# Patient Record
Sex: Female | Born: 1995 | Race: Black or African American | Hispanic: No | Marital: Single | Smoking: Never smoker
Health system: Southern US, Community
[De-identification: ages and names within clinical notes are randomized; demographics above are authoritative.]

## PROBLEM LIST (undated history)

## (undated) ENCOUNTER — Emergency Department (HOSPITAL_COMMUNITY): Admission: EM | Disposition: A | Discharge status: Home / Self Care | Payer: BC Managed Care – PPO

## (undated) DIAGNOSIS — J45909 Unspecified asthma, uncomplicated: Secondary | ICD-10-CM

## (undated) DIAGNOSIS — F419 Anxiety disorder, unspecified: Secondary | ICD-10-CM

## (undated) DIAGNOSIS — K219 Gastro-esophageal reflux disease without esophagitis: Secondary | ICD-10-CM

## (undated) HISTORY — PX: ANKLE FRACTURE SURGERY: SHX122

## (undated) HISTORY — DX: Anxiety disorder, unspecified: F41.9

## (undated) HISTORY — PX: TONSILLECTOMY: SUR1361

## (undated) HISTORY — PX: OTHER SURGICAL HISTORY: SHX169

---

## 2014-05-09 ENCOUNTER — Emergency Department (HOSPITAL_COMMUNITY)
Admission: EM | Admit: 2014-05-09 | Discharge: 2014-05-10 | Disposition: A | Discharge status: Home / Self Care | Payer: BC Managed Care – PPO | Attending: Emergency Medicine | Admitting: Emergency Medicine

## 2014-05-09 ENCOUNTER — Encounter (HOSPITAL_COMMUNITY): Payer: Self-pay | Admitting: Emergency Medicine

## 2014-05-09 DIAGNOSIS — Z3202 Encounter for pregnancy test, result negative: Secondary | ICD-10-CM | POA: Insufficient documentation

## 2014-05-09 DIAGNOSIS — R102 Pelvic and perineal pain: Secondary | ICD-10-CM

## 2014-05-09 DIAGNOSIS — N949 Unspecified condition associated with female genital organs and menstrual cycle: Secondary | ICD-10-CM | POA: Insufficient documentation

## 2014-05-09 DIAGNOSIS — R1012 Left upper quadrant pain: Secondary | ICD-10-CM | POA: Insufficient documentation

## 2014-05-09 DIAGNOSIS — R1032 Left lower quadrant pain: Secondary | ICD-10-CM | POA: Insufficient documentation

## 2014-05-09 DIAGNOSIS — R6883 Chills (without fever): Secondary | ICD-10-CM | POA: Insufficient documentation

## 2014-05-09 DIAGNOSIS — M940 Chondrocostal junction syndrome [Tietze]: Secondary | ICD-10-CM | POA: Insufficient documentation

## 2014-05-09 DIAGNOSIS — Z8781 Personal history of (healed) traumatic fracture: Secondary | ICD-10-CM | POA: Insufficient documentation

## 2014-05-09 DIAGNOSIS — Z8719 Personal history of other diseases of the digestive system: Secondary | ICD-10-CM | POA: Insufficient documentation

## 2014-05-09 HISTORY — DX: Gastro-esophageal reflux disease without esophagitis: K21.9

## 2014-05-09 LAB — PREGNANCY, URINE: Preg Test, Ur: NEGATIVE

## 2014-05-09 NOTE — ED Notes (Signed)
Pt c/o left sided flank pain radiating to llq. Pt c/o nausea. Denies dysuria. Pt c/o mild right sided chest pain that burns.

## 2014-05-10 ENCOUNTER — Ambulatory Visit (HOSPITAL_COMMUNITY)
Admit: 2014-05-10 | Discharge: 2014-05-10 | Disposition: A | Discharge status: Home / Self Care | Payer: BC Managed Care – PPO | Source: Ambulatory Visit | Attending: Emergency Medicine | Admitting: Emergency Medicine

## 2014-05-10 ENCOUNTER — Other Ambulatory Visit (HOSPITAL_COMMUNITY): Payer: Self-pay | Admitting: Emergency Medicine

## 2014-05-10 ENCOUNTER — Ambulatory Visit (HOSPITAL_COMMUNITY)
Admit: 2014-05-10 | Discharge: 2014-05-10 | Disposition: A | Discharge status: Home / Self Care | Payer: BC Managed Care – PPO | Attending: Emergency Medicine | Admitting: Emergency Medicine

## 2014-05-10 DIAGNOSIS — R1032 Left lower quadrant pain: Secondary | ICD-10-CM | POA: Insufficient documentation

## 2014-05-10 LAB — URINALYSIS, ROUTINE W REFLEX MICROSCOPIC
BILIRUBIN URINE: NEGATIVE
Glucose, UA: NEGATIVE mg/dL
Ketones, ur: NEGATIVE mg/dL
Leukocytes, UA: NEGATIVE
Nitrite: NEGATIVE
PH: 6 (ref 5.0–8.0)
Protein, ur: NEGATIVE mg/dL
SPECIFIC GRAVITY, URINE: 1.015 (ref 1.005–1.030)
Urobilinogen, UA: 0.2 mg/dL (ref 0.0–1.0)

## 2014-05-10 LAB — URINE MICROSCOPIC-ADD ON

## 2014-05-10 LAB — WET PREP, GENITAL
Trich, Wet Prep: NONE SEEN
YEAST WET PREP: NONE SEEN

## 2014-05-10 MED ORDER — HYDROCODONE-ACETAMINOPHEN 5-325 MG PO TABS: 1.0000 | ORAL_TABLET | ORAL | Status: DC | PRN

## 2014-05-10 MED ORDER — HYDROCODONE-ACETAMINOPHEN 5-325 MG PO TABS
1.0000 | ORAL_TABLET | Freq: Once | ORAL | Status: AC
Start: 2014-05-10 — End: 2014-05-10
  Administered 2014-05-10: 1 via ORAL
  Filled 2014-05-10: qty 1

## 2014-05-10 MED ORDER — NAPROXEN 250 MG PO TABS
500.0000 mg | ORAL_TABLET | Freq: Once | ORAL | Status: AC
  Administered 2014-05-10: 500 mg via ORAL
  Filled 2014-05-10: qty 2

## 2014-05-10 NOTE — ED Provider Notes (Signed)
Pt returns for outpatient pelvic US.   Pt given her results. She does not have a GYN, recommended [facility] as it is the only GYN office in [place]. She states she has left sided abdominal/pelvic pain. She was given pain medication to take. Advised to return for fever, vomiting or worsening pain.    [place] Transvaginal Non-ob  [place] Pelvis Complete  05/10/2014   CLINICAL DATA:  Left lower quadrant pain  EXAM: TRANSABDOMINAL AND TRANSVAGINAL ULTRASOUND OF PELVIS  TECHNIQUE: Both transabdominal and transvaginal ultrasound examinations of the pelvis were performed. Transabdominal technique was performed for global imaging of the pelvis including uterus, ovaries, adnexal regions, and pelvic cul-de-sac. It was necessary to proceed with endovaginal exam following the transabdominal exam to visualize the ovaries and adnexa in better detail.  COMPARISON:  None  FINDINGS: Uterus  Measurements: 7.1 x 3.2 x 4.3 cm. No fibroids or other mass visualized.  Endometrium  Thickness: 1.4 mm.  No focal abnormality visualized.  Right ovary  Measurements: 3.7 x 2.1 x 4.1 cm. Normal appearance/no adnexal mass.  Left ovary  Measurements: 4.3 x 2.8 x 2.8 cm. Normal appearance/no adnexal mass.  Other findings  No free fluid.  IMPRESSION: Normal pelvic ultrasound for age.  No acute finding.   Electronically Signed   By: Ruel Favorsrevor  Shick M.D.   On: 05/10/2014 10:19    Sonya AlbeIva Maureen Delatte, MD, Franz DellFACEP   Cella Cappello L Marcelline Temkin, MD 05/10/14 [phone]

## 2014-05-10 NOTE — ED Provider Notes (Addendum)
CSN: [id number]     Arrival date & time 05/09/14  2215 History   First MD Initiated Contact with Patient 05/10/14 0007   This chart was scribed for Hanley Seamen, MD by Gwenevere Abbot, ED scribe. This patient was seen in room APA15/APA15 and the patient's care was started at 12:09 AM.    Chief Complaint  Patient presents with  . Abdominal Pain   The history is provided by the patient. No language interpreter was used.   HPI Comments:  Sonya Alexander is a 18 y.o. female who presents to the Emergency Department complaining of left flank pain that radiates to the epigastrium since yesterday. The pain is about a 7/10. It is worse with movement or palpation. She is also having chest wall pain primarily on the right parasternal region, worse with movement or palpation. She denies shortness of breath. She denies fever, vomiting, diarrhea, vaginal bleeding, vaginal discharge, dysuria or hematuria. She has had chills and nausea.  Past Medical History  Diagnosis Date  . GERD (gastroesophageal reflux disease)    Past Surgical History  Procedure Laterality Date  . Tonsillectomy    . Ankle fracture surgery     No family history on file. History  Substance Use Topics  . Smoking status: Never Smoker   . Smokeless tobacco: Not on file  . Alcohol Use: No   OB History   Grav Para Term Preterm Abortions TAB SAB Ect Mult Living                 Review of Systems  Constitutional: Positive for chills.  Cardiovascular: Positive for chest pain.  Genitourinary: Positive for flank pain.  All other systems reviewed and are negative.   Allergies  Review of patient's allergies indicates no known allergies.  Home Medications   Prior to Admission medications   Not on File   BP 116/59  Pulse 80  Temp(Src) 98.8 F (37.1 C)  Resp 20  Ht 5' 6.5" (1.689 m)  Wt 149 lb (67.586 kg)  BMI 23.69 kg/m2  SpO2 100%  LMP 04/28/2014 Physical Exam  Nursing note and vitals reviewed. Constitutional: She is  oriented to person, place, and time. She appears well-developed and well-nourished.  HENT:  Head: Normocephalic and atraumatic.  Eyes: EOM are normal. Pupils are equal, round, and reactive to light.  Neck: Normal range of motion. Neck supple.  Cardiovascular: Normal rate, regular rhythm and normal heart sounds.   Pulmonary/Chest: Effort normal. She exhibits tenderness (Bilateral peristernal tenderness; right greater than left.).  Abdominal: Soft. Bowel sounds are normal. She exhibits no mass. There is tenderness (LUQ and LLQ tenderness. ).  Genitourinary:  Left CVA tenderness.   Musculoskeletal: Normal range of motion.  Neurological: She is alert and oriented to person, place, and time.  Skin: Skin is warm and dry.  Psychiatric:  Flat affect.  GU: Normal external genitalia; cervical erythema; green mucoid cervical discharge; no cervical motion tenderness; no adnexal tenderness  ED Course  Procedures  DIAGNOSTIC STUDIES: Oxygen Saturation is 100% on RA, normal by my interpretation.  COORDINATION OF CARE: 12:16 AM-Discussed treatment plan with pt at bedside and pt agreed to plan.   MDM   Nursing notes and vitals signs, including pulse oximetry, reviewed.  Summary of this visit's results, reviewed by myself:  Labs:  Results for orders placed during the hospital encounter of 05/09/14 (from the past 24 hour(s))  URINALYSIS, ROUTINE W REFLEX MICROSCOPIC     Status: Abnormal   Collection Time  05/09/14 11:44 PM      Result Value Ref Range   Color, Urine YELLOW  YELLOW   APPearance CLEAR  CLEAR   Specific Gravity, Urine 1.015  1.005 - 1.030   pH 6.0  5.0 - 8.0   Glucose, UA NEGATIVE  NEGATIVE mg/dL   Hgb urine dipstick TRACE (*) NEGATIVE   Bilirubin Urine NEGATIVE  NEGATIVE   Ketones, ur NEGATIVE  NEGATIVE mg/dL   Protein, ur NEGATIVE  NEGATIVE mg/dL   Urobilinogen, UA 0.2  0.0 - 1.0 mg/dL   Nitrite NEGATIVE  NEGATIVE   Leukocytes, UA NEGATIVE  NEGATIVE  PREGNANCY, URINE      Status: None   Collection Time    05/09/14 11:44 PM      Result Value Ref Range   Preg Test, Ur NEGATIVE  NEGATIVE  URINE MICROSCOPIC-ADD ON     Status: None   Collection Time    05/09/14 11:44 PM      Result Value Ref Range   Squamous Epithelial / LPF RARE  RARE   WBC, UA 0-2  <3 WBC/hpf   RBC / HPF 0-2  <3 RBC/hpf   Bacteria, UA RARE  RARE  WET PREP, GENITAL     Status: Abnormal   Collection Time    05/10/14 12:49 AM      Result Value Ref Range   Yeast Wet Prep HPF POC NONE SEEN  NONE SEEN   Trich, Wet Prep NONE SEEN  NONE SEEN   Clue Cells Wet Prep HPF POC FEW (*) NONE SEEN   WBC, Wet Prep HPF POC FEW (*) NONE SEEN   1:21 AM The patient states she was tested for STDs by her PCP a month ago and was negative. She was treated for BV and yeast infection at that time. She has not been sexually active in several months and when she was sexually active states he used protection. Though her cervix is erythematous with mucoid discharge she had no cervical motion tenderness or adnexal tenderness. Her symptoms are still concerning for gynecologic origin and we will have her return for an ultrasound.  I personally performed the services described in this documentation, which was scribed in my presence. The recorded information has been reviewed and is accurate.   Hanley SeamenJohn L Ola Fawver, MD 05/10/14 0122  Carlisle BeersJohn L Monzerat Handler, MD 05/10/14 [phone]

## 2014-05-12 LAB — GC/CHLAMYDIA PROBE AMP
CT Probe RNA: NEGATIVE
GC Probe RNA: NEGATIVE

## 2014-05-16 MED FILL — Hydrocodone-Acetaminophen Tab 5-325 MG: ORAL | Qty: 6 | Status: AC

## 2014-06-05 ENCOUNTER — Encounter (HOSPITAL_COMMUNITY): Payer: Self-pay | Admitting: Emergency Medicine

## 2014-06-05 ENCOUNTER — Emergency Department (HOSPITAL_COMMUNITY)
Admission: EM | Admit: 2014-06-05 | Discharge: 2014-06-05 | Disposition: A | Discharge status: Home / Self Care | Payer: BC Managed Care – PPO | Attending: Emergency Medicine | Admitting: Emergency Medicine

## 2014-06-05 DIAGNOSIS — Z8781 Personal history of (healed) traumatic fracture: Secondary | ICD-10-CM | POA: Diagnosis not present

## 2014-06-05 DIAGNOSIS — Z79899 Other long term (current) drug therapy: Secondary | ICD-10-CM | POA: Diagnosis not present

## 2014-06-05 DIAGNOSIS — Z8719 Personal history of other diseases of the digestive system: Secondary | ICD-10-CM | POA: Diagnosis not present

## 2014-06-05 DIAGNOSIS — N3090 Cystitis, unspecified without hematuria: Secondary | ICD-10-CM | POA: Diagnosis not present

## 2014-06-05 DIAGNOSIS — Z3202 Encounter for pregnancy test, result negative: Secondary | ICD-10-CM | POA: Insufficient documentation

## 2014-06-05 DIAGNOSIS — R319 Hematuria, unspecified: Secondary | ICD-10-CM | POA: Diagnosis present

## 2014-06-05 LAB — POC URINE PREG, ED: Preg Test, Ur: NEGATIVE

## 2014-06-05 LAB — URINE MICROSCOPIC-ADD ON

## 2014-06-05 LAB — URINALYSIS, ROUTINE W REFLEX MICROSCOPIC
Bilirubin Urine: NEGATIVE
Glucose, UA: NEGATIVE mg/dL
Ketones, ur: NEGATIVE mg/dL
Nitrite: NEGATIVE
Protein, ur: NEGATIVE mg/dL
Specific Gravity, Urine: 1.012 (ref 1.005–1.030)
Urobilinogen, UA: 0.2 mg/dL (ref 0.0–1.0)
pH: 7 (ref 5.0–8.0)

## 2014-06-05 MED ORDER — SULFAMETHOXAZOLE-TRIMETHOPRIM 800-160 MG PO TABS: 1.0000 | ORAL_TABLET | Freq: Two times a day (BID) | ORAL | Status: DC

## 2014-06-05 MED ORDER — PHENAZOPYRIDINE HCL 200 MG PO TABS: 200.0000 mg | ORAL_TABLET | Freq: Three times a day (TID) | ORAL | Status: DC

## 2014-06-05 MED ORDER — SULFAMETHOXAZOLE-TMP DS 800-160 MG PO TABS
1.0000 | ORAL_TABLET | Freq: Once | ORAL | Status: AC
  Administered 2014-06-05: 1 via ORAL
  Filled 2014-06-05: qty 1

## 2014-06-05 MED ORDER — PHENAZOPYRIDINE HCL 100 MG PO TABS
95.0000 mg | ORAL_TABLET | Freq: Once | ORAL | Status: AC
  Administered 2014-06-05: 100 mg via ORAL
  Filled 2014-06-05: qty 1

## 2014-06-05 NOTE — ED Notes (Signed)
Per pt sts since yesterday she has been having blood in urine and pain with urination. sts also some cold chills.

## 2014-06-05 NOTE — ED Provider Notes (Signed)
CSN: [id number]     Arrival date & time 06/05/14  1428 History   First MD Initiated Contact with Patient 06/05/14 1757     Chief Complaint  Patient presents with  . Hematuria     (Consider location/radiation/quality/duration/timing/severity/associated sxs/prior Treatment) HPI Sonya Alexander is a 18 y.o. female who presents emergency department complaining of dysuria and hematuria. Patient states her symptoms started yesterday. States she has seen bright red blood in her urination she urinates. She thought it might be vaginal so she placed a tampon, but continued to have blood in her urine. States there is no blood in her tampon when she removed it. Patient reports, lower abdominal pressure when she urinates, and burning sensation. She denies any fever or chills. She denies any back pain. She denies any vomiting but does admit to some nausea. She has not tried any medications for this. She denies history of the same. She denies abnormal vaginal discharge or bleeding. No other complaints.  Past Medical History  Diagnosis Date  . GERD (gastroesophageal reflux disease)    Past Surgical History  Procedure Laterality Date  . Tonsillectomy    . Ankle fracture surgery     History reviewed. No pertinent family history. History  Substance Use Topics  . Smoking status: Never Smoker   . Smokeless tobacco: Not on file  . Alcohol Use: No   OB History   Grav Para Term Preterm Abortions TAB SAB Ect Mult Living                 Review of Systems  Constitutional: Negative for fever and chills.  Respiratory: Negative for cough, chest tightness and shortness of breath.   Cardiovascular: Negative for chest pain, palpitations and leg swelling.  Gastrointestinal: Positive for nausea and abdominal pain. Negative for vomiting and diarrhea.  Genitourinary: Positive for dysuria and hematuria. Negative for flank pain, vaginal bleeding, vaginal discharge, vaginal pain and pelvic pain.  Musculoskeletal:  Negative for arthralgias, myalgias, neck pain and neck stiffness.  Skin: Negative for rash.  Neurological: Negative for dizziness, weakness and headaches.  All other systems reviewed and are negative.     Allergies  Review of patient's allergies indicates no known allergies.  Home Medications   Prior to Admission medications   Medication Sig Start Date End Date Taking? Authorizing Provider  albuterol (PROVENTIL HFA;VENTOLIN HFA) 108 (90 BASE) MCG/ACT inhaler Inhale 2 puffs into the lungs every 6 (six) hours as needed for wheezing or shortness of breath.   Yes Historical Provider, MD  etonogestrel (NEXPLANON) 68 MG IMPL implant Inject 1 each into the skin once.   Yes Historical Provider, MD  Simethicone (ANTI GAS PO) Take 1 tablet by mouth daily as needed (gas).   Yes Historical Provider, MD   BP 133/70  Pulse 86  Temp(Src) 98.3 F (36.8 C) (Oral)  Resp 16  Ht 5' 6.5" (1.689 m)  Wt 155 lb (70.308 kg)  BMI 24.65 kg/m2  SpO2 100%  LMP 04/29/2014 Physical Exam  Nursing note and vitals reviewed. Constitutional: She appears well-developed and well-nourished. No distress.  HENT:  Head: Normocephalic.  Eyes: Conjunctivae are normal.  Neck: Neck supple.  Cardiovascular: Normal rate, regular rhythm and normal heart sounds.   Pulmonary/Chest: Effort normal and breath sounds normal. No respiratory distress. She has no wheezes. She has no rales.  Abdominal: Soft. Bowel sounds are normal. She exhibits no distension. There is no tenderness. There is no rebound and no guarding.  No CVA tenderness bilaterally.  Musculoskeletal:  She exhibits no edema.  Neurological: She is alert.  Skin: Skin is warm and dry.  Psychiatric: She has a normal mood and affect. Her behavior is normal.    ED Course  Procedures (including critical care time) Labs Review Labs Reviewed  URINALYSIS, ROUTINE W REFLEX MICROSCOPIC - Abnormal; Notable for the following:    APPearance CLOUDY (*)    Hgb urine  dipstick LARGE (*)    Leukocytes, UA SMALL (*)    All other components within normal limits  URINE MICROSCOPIC-ADD ON  POC URINE PREG, ED    Imaging Review No results found.   EKG Interpretation None      MDM   Final diagnoses:  Cystitis    Patient with dysuria and hematuria, no abdominal tenderness on exam, no CVA tenderness. She's afebrile, nontoxic appearing. Urinalysis shows cloudy urine, small leukocytes, all 7-10 wbc's, large hemoglobin. Given she has no pain, I doubt this is a kidney stone. Most likely hemorrhagic cystitis. Will place on Bactrim, pyridium,  followup with primary care Dr. Given precautions to return if symptoms are worsening.  Filed Vitals:   06/05/14 1433 06/05/14 1810  BP: 137/66 133/70  Pulse: 86 86  Temp: 98.3 F (36.8 C)   TempSrc: Oral   Resp: 20 16  Height: 5' 6.5" (1.689 m)   Weight: 155 lb (70.308 kg)   SpO2: 100% 100%       Myriam Jacobsonatyana A Judye Lorino, PA-C 06/05/14 2358

## 2014-06-05 NOTE — Discharge Instructions (Signed)
Take Septra as prescribed until all gone. Pyridium for burning sensation. Tylenol or Motrin for pain. Followup with primary care doctor in one week. Return if your symptoms are worsening.  Urinary Tract Infection Urinary tract infections (UTIs) can develop anywhere along your urinary tract. Your urinary tract is your body's drainage system for removing wastes and extra water. Your urinary tract includes two kidneys, two ureters, a bladder, and a urethra. Your kidneys are a pair of bean-shaped organs. Each kidney is about the size of your fist. They are located below your ribs, one on each side of your spine. CAUSES Infections are caused by microbes, which are microscopic organisms, including fungi, viruses, and bacteria. These organisms are so small that they can only be seen through a microscope. Bacteria are the microbes that most commonly cause UTIs. SYMPTOMS  Symptoms of UTIs may vary by age and gender of the patient and by the location of the infection. Symptoms in young women typically include a frequent and intense urge to urinate and a painful, burning feeling in the bladder or urethra during urination. Older women and men are more likely to be tired, shaky, and weak and have muscle aches and abdominal pain. A fever may mean the infection is in your kidneys. Other symptoms of a kidney infection include pain in your back or sides below the ribs, nausea, and vomiting. DIAGNOSIS To diagnose a UTI, your caregiver will ask you about your symptoms. Your caregiver also will ask to provide a urine sample. The urine sample will be tested for bacteria and white blood cells. White blood cells are made by your body to help fight infection. TREATMENT  Typically, UTIs can be treated with medication. Because most UTIs are caused by a bacterial infection, they usually can be treated with the use of antibiotics. The choice of antibiotic and length of treatment depend on your symptoms and the type of bacteria  causing your infection. HOME CARE INSTRUCTIONS  If you were prescribed antibiotics, take them exactly as your caregiver instructs you. Finish the medication even if you feel better after you have only taken some of the medication.  Drink enough water and fluids to keep your urine clear or pale yellow.  Avoid caffeine, tea, and carbonated beverages. They tend to irritate your bladder.  Empty your bladder often. Avoid holding urine for long periods of time.  Empty your bladder before and after sexual intercourse.  After a bowel movement, women should cleanse from front to back. Use each tissue only once. SEEK MEDICAL CARE IF:   You have back pain.  You develop a fever.  Your symptoms do not begin to resolve within 3 days. SEEK IMMEDIATE MEDICAL CARE IF:   You have severe back pain or lower abdominal pain.  You develop chills.  You have nausea or vomiting.  You have continued burning or discomfort with urination. MAKE SURE YOU:   Understand these instructions.  Will watch your condition.  Will get help right away if you are not doing well or get worse. Document Released: 07/13/2005 Document Revised: 04/03/2012 Document Reviewed: 11/11/2011 [facility] Patient Information 2015 [employer] [place]. This information is not intended to replace advice given to you by your health care provider. Make sure you discuss any questions you have with your health care provider.

## 2014-06-10 NOTE — ED Provider Notes (Signed)
Medical screening examination/treatment/procedure(s) were performed by non-physician practitioner and as supervising physician I was immediately available for consultation/collaboration.   EKG Interpretation None       Thursa Emme, MD 06/10/14 1614 

## 2014-08-04 ENCOUNTER — Encounter (HOSPITAL_COMMUNITY): Payer: Self-pay | Admitting: Emergency Medicine

## 2014-08-04 ENCOUNTER — Emergency Department (HOSPITAL_COMMUNITY)
Admission: EM | Admit: 2014-08-04 | Discharge: 2014-08-04 | Disposition: A | Discharge status: Home / Self Care | Payer: BC Managed Care – PPO | Attending: Emergency Medicine | Admitting: Emergency Medicine

## 2014-08-04 DIAGNOSIS — J45909 Unspecified asthma, uncomplicated: Secondary | ICD-10-CM | POA: Insufficient documentation

## 2014-08-04 DIAGNOSIS — R109 Unspecified abdominal pain: Secondary | ICD-10-CM

## 2014-08-04 DIAGNOSIS — Z79899 Other long term (current) drug therapy: Secondary | ICD-10-CM | POA: Insufficient documentation

## 2014-08-04 DIAGNOSIS — Z3202 Encounter for pregnancy test, result negative: Secondary | ICD-10-CM | POA: Insufficient documentation

## 2014-08-04 DIAGNOSIS — Z8719 Personal history of other diseases of the digestive system: Secondary | ICD-10-CM | POA: Insufficient documentation

## 2014-08-04 HISTORY — DX: Unspecified asthma, uncomplicated: J45.909

## 2014-08-04 LAB — URINALYSIS, ROUTINE W REFLEX MICROSCOPIC
BILIRUBIN URINE: NEGATIVE
Glucose, UA: NEGATIVE mg/dL
Hgb urine dipstick: NEGATIVE
Ketones, ur: NEGATIVE mg/dL
Leukocytes, UA: NEGATIVE
NITRITE: NEGATIVE
PH: 7 (ref 5.0–8.0)
Protein, ur: NEGATIVE mg/dL
Specific Gravity, Urine: 1.01 (ref 1.005–1.030)
UROBILINOGEN UA: 0.2 mg/dL (ref 0.0–1.0)

## 2014-08-04 LAB — COMPREHENSIVE METABOLIC PANEL
ALT: 8 U/L (ref 0–35)
ANION GAP: 11 (ref 5–15)
AST: 11 U/L (ref 0–37)
Albumin: 4 g/dL (ref 3.5–5.2)
Alkaline Phosphatase: 67 U/L (ref 39–117)
BUN: 11 mg/dL (ref 6–23)
CALCIUM: 9.7 mg/dL (ref 8.4–10.5)
CO2: 26 meq/L (ref 19–32)
CREATININE: 0.75 mg/dL (ref 0.50–1.10)
Chloride: 104 mEq/L (ref 96–112)
Glucose, Bld: 98 mg/dL (ref 70–99)
Potassium: 4.1 mEq/L (ref 3.7–5.3)
Sodium: 141 mEq/L (ref 137–147)
Total Bilirubin: 0.4 mg/dL (ref 0.3–1.2)
Total Protein: 7.1 g/dL (ref 6.0–8.3)

## 2014-08-04 LAB — CBC
HCT: 38.7 % (ref 36.0–46.0)
Hemoglobin: 13.1 g/dL (ref 12.0–15.0)
MCH: 32 pg (ref 26.0–34.0)
MCHC: 33.9 g/dL (ref 30.0–36.0)
MCV: 94.6 fL (ref 78.0–100.0)
PLATELETS: 236 10*3/uL (ref 150–400)
RBC: 4.09 MIL/uL (ref 3.87–5.11)
RDW: 13.1 % (ref 11.5–15.5)
WBC: 6.2 10*3/uL (ref 4.0–10.5)

## 2014-08-04 LAB — PREGNANCY, URINE: Preg Test, Ur: NEGATIVE

## 2014-08-04 MED ORDER — DICYCLOMINE HCL 20 MG PO TABS: 20.0000 mg | ORAL_TABLET | Freq: Two times a day (BID) | ORAL | Status: DC

## 2014-08-04 NOTE — ED Notes (Signed)
Patient complaining of abdominal pain with other multiple complaints.

## 2014-08-04 NOTE — Discharge Instructions (Signed)
Your testing has been normal - have your doctor follow up on the thyroid test - you have normal blood work and urine testing otherwise - this may be irritable bowel syndrome.  Please call your doctor for a followup appointment within 24-48 hours. When you talk to your doctor please let them know that you were seen in the emergency department and have them acquire all of your records so that they can discuss the findings with you and formulate a treatment plan to fully care for your new and ongoing problems.

## 2014-08-04 NOTE — ED Provider Notes (Signed)
CSN: [id number]     Arrival date & time 08/04/14  1609 History   First MD Initiated Contact with Patient 08/04/14 1943     Chief Complaint  Patient presents with  . Abdominal Pain     (Consider location/radiation/quality/duration/timing/severity/associated sxs/prior Treatment) HPI Comments: Approximately July started having was having decreased appetite, was having some increased bowel sounds and intermittent abdominal pains.  Has been occuring daily since that time.  Takes meds for GERD - takes meds when she needs it.  Sx are intermittent, most days it is present, not associated with rectal bleeding or watery diarrhea.  Denies vag d/c.  When she eats - she gets early satiety, feels nauseated but doesn't vomit.  Is able to get 1/2 meal down - has lost ~ 20 pounds in sevral months.  She has ongoing light headedness.  Doesn't have menstrual cycles since implanon in July.    Bowels, seem to have regular soft stools - uses bathroom frequently.    Has some discoloration of fingers and toes when they get cold, turn purple and pale.    Has never had thyroid tested.    Patient is a 18 y.o. female presenting with abdominal pain. The history is provided by the patient.  Abdominal Pain   Past Medical History  Diagnosis Date  . GERD (gastroesophageal reflux disease)   . Asthma    Past Surgical History  Procedure Laterality Date  . Tonsillectomy    . Ankle fracture surgery    . Tubes in ears     History reviewed. No pertinent family history. History  Substance Use Topics  . Smoking status: Never Smoker   . Smokeless tobacco: Not on file  . Alcohol Use: No   OB History   Grav Para Term Preterm Abortions TAB SAB Ect Mult Living                 Review of Systems  Gastrointestinal: Positive for abdominal pain.  All other systems reviewed and are negative.     Allergies  Review of patient's allergies indicates no known allergies.  Home Medications   Prior to Admission  medications   Medication Sig Start Date End Date Taking? Authorizing Provider  albuterol (PROVENTIL HFA;VENTOLIN HFA) 108 (90 BASE) MCG/ACT inhaler Inhale 2 puffs into the lungs every 6 (six) hours as needed for wheezing or shortness of breath.    Historical Provider, MD  dicyclomine (BENTYL) 20 MG tablet Take 1 tablet (20 mg total) by mouth 2 (two) times daily. 08/04/14   Vida RollerBrian D Maili Shutters, MD  etonogestrel (NEXPLANON) 68 MG IMPL implant Inject 1 each into the skin once.    Historical Provider, MD   BP 115/68  Pulse 75  Temp(Src) 99.4 F (37.4 C) (Oral)  Resp 16  Ht 5\' 7"  (1.702 m)  Wt 150 lb (68.04 kg)  BMI 23.49 kg/m2  SpO2 100% Physical Exam  Nursing note and vitals reviewed. Constitutional: She appears well-developed and well-nourished. No distress.  HENT:  Head: Normocephalic and atraumatic.  Mouth/Throat: Oropharynx is clear and moist. No oropharyngeal exudate.  Eyes: Conjunctivae and EOM are normal. Pupils are equal, round, and reactive to light. Right eye exhibits no discharge. Left eye exhibits no discharge. No scleral icterus.  Neck: Normal range of motion. Neck supple. No JVD present. No thyromegaly present.  Cardiovascular: Normal rate, regular rhythm, normal heart sounds and intact distal pulses.  Exam reveals no gallop and no friction rub.   No murmur heard. Pulmonary/Chest: Effort normal and  breath sounds normal. No respiratory distress. She has no wheezes. She has no rales.  Abdominal: Soft. Bowel sounds are normal. She exhibits no distension and no mass. There is no tenderness.  Musculoskeletal: Normal range of motion. She exhibits no edema and no tenderness.  Lymphadenopathy:    She has no cervical adenopathy.  Neurological: She is alert. Coordination normal.  Skin: Skin is warm and dry. No rash noted. No erythema.  Psychiatric: She has a normal mood and affect. Her behavior is normal.    ED Course  Procedures (including critical care time) Labs Review Labs  Reviewed  URINALYSIS, ROUTINE W REFLEX MICROSCOPIC - Abnormal; Notable for the following:    Color, Urine STRAW (*)    All other components within normal limits  PREGNANCY, URINE  CBC  COMPREHENSIVE METABOLIC PANEL  TSH    Imaging Review No results found.    MDM   Final diagnoses:  Abdominal pain, unspecified abdominal location    At this time she has no abd ttp, normal VS and history that points to IBS more than any other source - check liver and GB function, r/o anemia and check thyroid.  Pt may have Raynaud's phenomenon - not at this time.  Tolerating PO, labs normal, pt informed, TSH pending, stable for d/c.  Meds given in ED:  Medications - No data to display  New Prescriptions   DICYCLOMINE (BENTYL) 20 MG TABLET    Take 1 tablet (20 mg total) by mouth 2 (two) times daily.      Vida RollerBrian D Anthany Thornhill, MD 08/04/14 2123

## 2014-08-05 LAB — TSH: TSH: 1.37 u[IU]/mL (ref 0.350–4.500)

## 2015-04-16 ENCOUNTER — Emergency Department (HOSPITAL_COMMUNITY)
Admission: EM | Admit: 2015-04-16 | Discharge: 2015-04-16 | Disposition: A | Discharge status: Home / Self Care | Payer: BLUE CROSS/BLUE SHIELD | Attending: Emergency Medicine | Admitting: Emergency Medicine

## 2015-04-16 ENCOUNTER — Encounter (HOSPITAL_COMMUNITY): Payer: Self-pay | Admitting: Emergency Medicine

## 2015-04-16 ENCOUNTER — Emergency Department (HOSPITAL_COMMUNITY): Payer: BLUE CROSS/BLUE SHIELD

## 2015-04-16 DIAGNOSIS — H9209 Otalgia, unspecified ear: Secondary | ICD-10-CM | POA: Diagnosis not present

## 2015-04-16 DIAGNOSIS — Z793 Long term (current) use of hormonal contraceptives: Secondary | ICD-10-CM | POA: Insufficient documentation

## 2015-04-16 DIAGNOSIS — H538 Other visual disturbances: Secondary | ICD-10-CM | POA: Diagnosis not present

## 2015-04-16 DIAGNOSIS — J45909 Unspecified asthma, uncomplicated: Secondary | ICD-10-CM | POA: Diagnosis not present

## 2015-04-16 DIAGNOSIS — Z79899 Other long term (current) drug therapy: Secondary | ICD-10-CM | POA: Diagnosis not present

## 2015-04-16 DIAGNOSIS — Z3202 Encounter for pregnancy test, result negative: Secondary | ICD-10-CM | POA: Diagnosis not present

## 2015-04-16 DIAGNOSIS — Z8719 Personal history of other diseases of the digestive system: Secondary | ICD-10-CM | POA: Diagnosis not present

## 2015-04-16 DIAGNOSIS — R42 Dizziness and giddiness: Secondary | ICD-10-CM | POA: Insufficient documentation

## 2015-04-16 DIAGNOSIS — J069 Acute upper respiratory infection, unspecified: Secondary | ICD-10-CM | POA: Diagnosis not present

## 2015-04-16 DIAGNOSIS — R05 Cough: Secondary | ICD-10-CM | POA: Diagnosis present

## 2015-04-16 LAB — CBC WITH DIFFERENTIAL/PLATELET
BASOS PCT: 0 % (ref 0–1)
Basophils Absolute: 0 10*3/uL (ref 0.0–0.1)
EOS ABS: 0.1 10*3/uL (ref 0.0–0.7)
Eosinophils Relative: 1 % (ref 0–5)
HEMATOCRIT: 47.4 % — AB (ref 36.0–46.0)
Hemoglobin: 15.9 g/dL — ABNORMAL HIGH (ref 12.0–15.0)
LYMPHS PCT: 19 % (ref 12–46)
Lymphs Abs: 1.6 10*3/uL (ref 0.7–4.0)
MCH: 31.1 pg (ref 26.0–34.0)
MCHC: 33.5 g/dL (ref 30.0–36.0)
MCV: 92.6 fL (ref 78.0–100.0)
Monocytes Absolute: 0.4 10*3/uL (ref 0.1–1.0)
Monocytes Relative: 5 % (ref 3–12)
Neutro Abs: 6.5 10*3/uL (ref 1.7–7.7)
Neutrophils Relative %: 75 % (ref 43–77)
Platelets: 255 10*3/uL (ref 150–400)
RBC: 5.12 MIL/uL — AB (ref 3.87–5.11)
RDW: 14.8 % (ref 11.5–15.5)
WBC: 8.6 10*3/uL (ref 4.0–10.5)

## 2015-04-16 LAB — COMPREHENSIVE METABOLIC PANEL
ALBUMIN: 4.6 g/dL (ref 3.5–5.0)
ALT: 14 U/L (ref 14–54)
ANION GAP: 7 (ref 5–15)
AST: 15 U/L (ref 15–41)
Alkaline Phosphatase: 77 U/L (ref 38–126)
BILIRUBIN TOTAL: 0.6 mg/dL (ref 0.3–1.2)
BUN: 7 mg/dL (ref 6–20)
CALCIUM: 9.1 mg/dL (ref 8.9–10.3)
CO2: 24 mmol/L (ref 22–32)
Chloride: 106 mmol/L (ref 101–111)
Creatinine, Ser: 0.68 mg/dL (ref 0.44–1.00)
GFR calc Af Amer: >60 mL/min (ref >60)
GFR calc non Af Amer: >60 mL/min (ref >60)
Glucose, Bld: 98 mg/dL (ref 65–99)
Potassium: 3.8 mmol/L (ref 3.5–5.1)
Sodium: 137 mmol/L (ref 135–145)
Total Protein: 7.6 g/dL (ref 6.5–8.1)

## 2015-04-16 LAB — URINALYSIS, ROUTINE W REFLEX MICROSCOPIC
BILIRUBIN URINE: NEGATIVE
GLUCOSE, UA: NEGATIVE mg/dL
Ketones, ur: NEGATIVE mg/dL
LEUKOCYTES UA: NEGATIVE
Nitrite: NEGATIVE
PH: 7 (ref 5.0–8.0)
PROTEIN: NEGATIVE mg/dL
Specific Gravity, Urine: 1.01 (ref 1.005–1.030)
Urobilinogen, UA: 0.2 mg/dL (ref 0.0–1.0)

## 2015-04-16 LAB — URINE MICROSCOPIC-ADD ON

## 2015-04-16 LAB — PREGNANCY, URINE: PREG TEST UR: NEGATIVE

## 2015-04-16 MED ORDER — SODIUM CHLORIDE 0.9 % IV SOLN
1000.0000 mL | Freq: Once | INTRAVENOUS | Status: AC
  Administered 2015-04-16: 1000 mL via INTRAVENOUS

## 2015-04-16 MED ORDER — SODIUM CHLORIDE 0.9 % IV SOLN
1000.0000 mL | INTRAVENOUS | Status: DC
  Administered 2015-04-16: 1000 mL via INTRAVENOUS

## 2015-04-16 NOTE — ED Notes (Signed)
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  Started with cough on Monday.  C/o earache to right and left ear.  Rates pain 8/10.

## 2015-04-16 NOTE — ED Notes (Signed)
Symptoms began on Monday. Deep cough, brown in color. Began feeling light headed yesterday. Pt is sniffling although denies nasal congestion. No one else in family is sick. Pt states her mother has a history of anemia and would like to be be checked to make sure her blood isn't low.

## 2015-04-16 NOTE — ED Provider Notes (Signed)
CSN: [id number]     Arrival date & time 04/16/15  1030 History   First MD Initiated Contact with Patient 04/16/15 1114     Chief Complaint  Patient presents with  . Cough  . Otalgia  . Blurred Vision     (Consider location/radiation/quality/duration/timing/severity/associated sxs/prior Treatment) HPI Comments: Patient is a 19 year old female who presents to the emergency department with a complaint of follow cough, body aches, generally not feeling well. The patient states these problems started on or about June 27. The patient complained of follow cough and some congestion. She noted some pain in her E her at times. She states that she has been coughing up of sometimes clear material, but sometimes a dark brown material. The patient states that she has not had any high fever. She's not had any shaking chills, and his been no vomiting or diarrhea. There's been no hot joints reported. It is of note that the patient was in [place] about the beginning of this month. The patient states that she was not exposed to any known illnesses, and particularly no known Ebola. There's been no unusual rash reported.  The history is provided by the patient.    Past Medical History  Diagnosis Date  . GERD (gastroesophageal reflux disease)   . Asthma    Past Surgical History  Procedure Laterality Date  . Tonsillectomy    . Ankle fracture surgery    . Tubes in ears     History reviewed. No pertinent family history. History  Substance Use Topics  . Smoking status: Never Smoker   . Smokeless tobacco: Not on file  . Alcohol Use: No   OB History    No data available     Review of Systems  Constitutional: Positive for appetite change. Negative for fever and chills.  HENT: Positive for congestion and ear pain.   Respiratory: Positive for cough.   Neurological: Positive for light-headedness.  All other systems reviewed and are negative.     Allergies  Review of patient's allergies  indicates no known allergies.  Home Medications   Prior to Admission medications   Medication Sig Start Date End Date Taking? Authorizing Provider  albuterol (PROVENTIL HFA;VENTOLIN HFA) 108 (90 BASE) MCG/ACT inhaler Inhale 2 puffs into the lungs every 6 (six) hours as needed for wheezing or shortness of breath.   Yes Historical Provider, MD  etonogestrel (NEXPLANON) 68 MG IMPL implant Inject 1 each into the skin once.   Yes Historical Provider, MD   BP 127/57 mmHg  Pulse 73  Temp(Src) 98.3 F (36.8 C) (Oral)  Resp 16  Ht 5\' 7"  (1.702 m)  Wt 140 lb (63.504 kg)  BMI 21.92 kg/m2  SpO2 100% Physical Exam  Constitutional: She is oriented to person, place, and time. She appears well-developed and well-nourished.  Non-toxic appearance.  HENT:  Head: Normocephalic.  Right Ear: Tympanic membrane and external ear normal.  Left Ear: Tympanic membrane and external ear normal.  Nasal congestion  Eyes: EOM and lids are normal. Pupils are equal, round, and reactive to light.  Neck: Normal range of motion. Neck supple. Carotid bruit is not present.  Cardiovascular: Normal rate, regular rhythm, normal heart sounds, intact distal pulses and normal pulses.   Pulmonary/Chest: Breath sounds normal. No respiratory distress.  Abdominal: Soft. Bowel sounds are normal. There is no tenderness. There is no guarding.  Musculoskeletal: Normal range of motion.  Lymphadenopathy:       Head (right side): No submandibular adenopathy present.  Head (left side): No submandibular adenopathy present.    She has no cervical adenopathy.  Neurological: She is alert and oriented to person, place, and time. She has normal strength. No cranial nerve deficit or sensory deficit.  Skin: Skin is warm and dry.  Psychiatric: She has a normal mood and affect. Her speech is normal.  Nursing note and vitals reviewed.   ED Course  Case reviewed by Dr Deretha Emory.  Procedures (including critical care time) Labs  Review Labs Reviewed  CBC WITH DIFFERENTIAL/PLATELET - Abnormal; Notable for the following:    RBC 5.12 (*)    Hemoglobin 15.9 (*)    HCT 47.4 (*)    All other components within normal limits  URINALYSIS, ROUTINE W REFLEX MICROSCOPIC (NOT AT [facility]) - Abnormal; Notable for the following:    Hgb urine dipstick TRACE (*)    All other components within normal limits  COMPREHENSIVE METABOLIC PANEL  PREGNANCY, URINE  URINE MICROSCOPIC-ADD ON    Imaging Review Dg Chest 2 View  04/16/2015   CLINICAL DATA:  Productive cough and chest pain for 2 days  EXAM: CHEST  2 VIEW  COMPARISON:  None.  FINDINGS: Lungs are clear. Heart size and pulmonary vascularity are normal. No adenopathy. No pneumothorax. No bone lesions.  IMPRESSION: No abnormality noted.   Electronically Signed   By: Bretta Bang III M.D.   On: 04/16/2015 13:38     EKG Interpretation None      MDM  Vital signs are well within normal limits. Patient has some congestion and cough in the emergency department time. Pulse oximetry is 100% on room air. Within normal limits by my interpretation. Complete blood count is well within normal limits. Components of metabolic panel is normal. Urinalysis shows a trace of hemoglobin, otherwise negative. Urine pregnancy test is negative. Chest x-ray is negative for pneumonia or acute event.  Suspect the patient has an upper respiratory infection. Patient is to increase fluids, use Tylenol or ibuprofen for fever, wash hands frequently on. Because of the patient's travel to [place] during the beginning of this month I have asked the patient to observe for temperature elevations, excessive headache, high fever, excessive vomiting, diarrhea, weakness. I've asked her to return immediately if any of these symptoms occur. The patient is in agreement with this discharge plan.    Final diagnoses:  None    **I have reviewed nursing notes, vital signs, and all appropriate lab and imaging results for  this patient.Ivery Quale, PA-C 04/16/15 1517  Ivery Quale, PA-C 04/16/15 1610  Vanetta Mulders, MD 04/16/15 [phone]

## 2015-04-16 NOTE — ED Notes (Signed)
Both eyes-------20/13 Right eye-------20/10 Left eye---------20/10

## 2015-04-16 NOTE — ED Notes (Signed)
Patient states eye feel blurred this am.

## 2015-04-16 NOTE — Discharge Instructions (Signed)
Your x-ray and lab test are negative for any acute problem. Suspect that you have an upper respiratory infection. Please increase water, juices, Gatorade. Please wash hands frequently. Please use Tylenol every 4 hours, or ibuprofen every 6 hours. Please use decongested of choice for nasal congestion. Please return immediately if any high fevers, shaking chills, unusual sweats, diarrhea, joint problems, or vomiting that is out of the ordinary. Upper Respiratory Infection, Adult An upper respiratory infection (URI) is also sometimes known as the common cold. The upper respiratory tract includes the nose, sinuses, throat, trachea, and bronchi. Bronchi are the airways leading to the lungs. Most people improve within 1 week, but symptoms can last up to 2 weeks. A residual cough may last even longer.  CAUSES Many different viruses can infect the tissues lining the upper respiratory tract. The tissues become irritated and inflamed and often become very moist. Mucus production is also common. A cold is contagious. You can easily spread the virus to others by oral contact. This includes kissing, sharing a glass, coughing, or sneezing. Touching your mouth or nose and then touching a surface, which is then touched by another person, can also spread the virus. SYMPTOMS  Symptoms typically develop 1 to 3 days after you come in contact with a cold virus. Symptoms vary from person to person. They may include:  Runny nose.  Sneezing.  Nasal congestion.  Sinus irritation.  Sore throat.  Loss of voice (laryngitis).  Cough.  Fatigue.  Muscle aches.  Loss of appetite.  Headache.  Low-grade fever. DIAGNOSIS  You might diagnose your own cold based on familiar symptoms, since most people get a cold 2 to 3 times a year. Your caregiver can confirm this based on your exam. Most importantly, your caregiver can check that your symptoms are not due to another disease such as strep throat, sinusitis, pneumonia,  asthma, or epiglottitis. Blood tests, throat tests, and X-rays are not necessary to diagnose a common cold, but they may sometimes be helpful in excluding other more serious diseases. Your caregiver will decide if any further tests are required. RISKS AND COMPLICATIONS  You may be at risk for a more severe case of the common cold if you smoke cigarettes, have chronic heart disease (such as heart failure) or lung disease (such as asthma), or if you have a weakened immune system. The very young and very old are also at risk for more serious infections. Bacterial sinusitis, middle ear infections, and bacterial pneumonia can complicate the common cold. The common cold can worsen asthma and chronic obstructive pulmonary disease (COPD). Sometimes, these complications can require emergency medical care and may be life-threatening. PREVENTION  The best way to protect against getting a cold is to practice good hygiene. Avoid oral or hand contact with people with cold symptoms. Wash your hands often if contact occurs. There is no clear evidence that vitamin C, vitamin E, echinacea, or exercise reduces the chance of developing a cold. However, it is always recommended to get plenty of rest and practice good nutrition. TREATMENT  Treatment is directed at relieving symptoms. There is no cure. Antibiotics are not effective, because the infection is caused by a virus, not by bacteria. Treatment may include:  Increased fluid intake. Sports drinks offer valuable electrolytes, sugars, and fluids.  Breathing heated mist or steam (vaporizer or shower).  Eating chicken soup or other clear broths, and maintaining good nutrition.  Getting plenty of rest.  Using gargles or lozenges for comfort.  Controlling fevers with  ibuprofen or acetaminophen as directed by your caregiver.  Increasing usage of your inhaler if you have asthma. Zinc gel and zinc lozenges, taken in the first 24 hours of the common cold, can shorten the  duration and lessen the severity of symptoms. Pain medicines may help with fever, muscle aches, and throat pain. A variety of non-prescription medicines are available to treat congestion and runny nose. Your caregiver can make recommendations and may suggest nasal or lung inhalers for other symptoms.  HOME CARE INSTRUCTIONS   Only take over-the-counter or prescription medicines for pain, discomfort, or fever as directed by your caregiver.  Use a warm mist humidifier or inhale steam from a shower to increase air moisture. This may keep secretions moist and make it easier to breathe.  Drink enough water and fluids to keep your urine clear or pale yellow.  Rest as needed.  Return to work when your temperature has returned to normal or as your caregiver advises. You may need to stay home longer to avoid infecting others. You can also use a face mask and careful hand washing to prevent spread of the virus. SEEK MEDICAL CARE IF:   After the first few days, you feel you are getting worse rather than better.  You need your caregiver's advice about medicines to control symptoms.  You develop chills, worsening shortness of breath, or brown or red sputum. These may be signs of pneumonia.  You develop yellow or brown nasal discharge or pain in the face, especially when you bend forward. These may be signs of sinusitis.  You develop a fever, swollen neck glands, pain with swallowing, or white areas in the back of your throat. These may be signs of strep throat. SEEK IMMEDIATE MEDICAL CARE IF:   You have a fever.  You develop severe or persistent headache, ear pain, sinus pain, or chest pain.  You develop wheezing, a prolonged cough, cough up blood, or have a change in your usual mucus (if you have chronic lung disease).  You develop sore muscles or a stiff neck. Document Released: 03/29/2001 Document Revised: 12/26/2011 Document Reviewed: 01/08/2014 [facility] Patient Information 2015 [place],  [place]. This information is not intended to replace advice given to you by your health care provider. Make sure you discuss any questions you have with your health care provider.

## 2015-12-10 ENCOUNTER — Encounter (HOSPITAL_COMMUNITY): Payer: Self-pay | Admitting: Emergency Medicine

## 2015-12-10 DIAGNOSIS — Z79899 Other long term (current) drug therapy: Secondary | ICD-10-CM | POA: Diagnosis not present

## 2015-12-10 DIAGNOSIS — J45909 Unspecified asthma, uncomplicated: Secondary | ICD-10-CM | POA: Diagnosis not present

## 2015-12-10 DIAGNOSIS — B373 Candidiasis of vulva and vagina: Secondary | ICD-10-CM | POA: Diagnosis not present

## 2015-12-10 DIAGNOSIS — Z3202 Encounter for pregnancy test, result negative: Secondary | ICD-10-CM | POA: Insufficient documentation

## 2015-12-10 DIAGNOSIS — R1030 Lower abdominal pain, unspecified: Secondary | ICD-10-CM | POA: Diagnosis present

## 2015-12-10 DIAGNOSIS — K6289 Other specified diseases of anus and rectum: Secondary | ICD-10-CM | POA: Insufficient documentation

## 2015-12-10 LAB — COMPREHENSIVE METABOLIC PANEL
ALK PHOS: 61 U/L (ref 38–126)
ALT: 11 U/L — ABNORMAL LOW (ref 14–54)
ANION GAP: 10 (ref 5–15)
AST: 15 U/L (ref 15–41)
Albumin: 3.8 g/dL (ref 3.5–5.0)
BILIRUBIN TOTAL: 0.4 mg/dL (ref 0.3–1.2)
BUN: 9 mg/dL (ref 6–20)
CALCIUM: 9.5 mg/dL (ref 8.9–10.3)
CO2: 23 mmol/L (ref 22–32)
Chloride: 105 mmol/L (ref 101–111)
Creatinine, Ser: 0.66 mg/dL (ref 0.44–1.00)
GFR calc Af Amer: >60 mL/min (ref >60)
Glucose, Bld: 84 mg/dL (ref 65–99)
Potassium: 4.1 mmol/L (ref 3.5–5.1)
SODIUM: 138 mmol/L (ref 135–145)
TOTAL PROTEIN: 5.9 g/dL — AB (ref 6.5–8.1)

## 2015-12-10 LAB — URINALYSIS, ROUTINE W REFLEX MICROSCOPIC
Bilirubin Urine: NEGATIVE
Glucose, UA: NEGATIVE mg/dL
Hgb urine dipstick: NEGATIVE
Ketones, ur: NEGATIVE mg/dL
Leukocytes, UA: NEGATIVE
NITRITE: NEGATIVE
PROTEIN: NEGATIVE mg/dL
Specific Gravity, Urine: 1.006 (ref 1.005–1.030)
pH: 6 (ref 5.0–8.0)

## 2015-12-10 LAB — CBC
HCT: 39.6 % (ref 36.0–46.0)
HEMOGLOBIN: 13 g/dL (ref 12.0–15.0)
MCH: 31 pg (ref 26.0–34.0)
MCHC: 32.8 g/dL (ref 30.0–36.0)
MCV: 94.3 fL (ref 78.0–100.0)
Platelets: 246 10*3/uL (ref 150–400)
RBC: 4.2 MIL/uL (ref 3.87–5.11)
RDW: 13 % (ref 11.5–15.5)
WBC: 7.8 10*3/uL (ref 4.0–10.5)

## 2015-12-10 LAB — LIPASE, BLOOD: Lipase: 22 U/L (ref 11–51)

## 2015-12-10 NOTE — ED Notes (Signed)
Patient arrives with complaint of medial abdominal pain which began today. States she felt okay this morning when she woke up, but later in the day after class her abdomen began to hurt. Also endorses rectal pain with sitting. Denies bleeding, fever, nausea, and vomiting.

## 2015-12-11 ENCOUNTER — Emergency Department (HOSPITAL_COMMUNITY)
Admission: EM | Admit: 2015-12-11 | Discharge: 2015-12-11 | Disposition: A | Discharge status: Home / Self Care | Payer: BLUE CROSS/BLUE SHIELD | Attending: Emergency Medicine | Admitting: Emergency Medicine

## 2015-12-11 DIAGNOSIS — R1030 Lower abdominal pain, unspecified: Secondary | ICD-10-CM

## 2015-12-11 DIAGNOSIS — B373 Candidiasis of vulva and vagina: Secondary | ICD-10-CM

## 2015-12-11 DIAGNOSIS — B3731 Acute candidiasis of vulva and vagina: Secondary | ICD-10-CM

## 2015-12-11 LAB — GC/CHLAMYDIA PROBE AMP (~~LOC~~) NOT AT ARMC
CHLAMYDIA, DNA PROBE: NEGATIVE
NEISSERIA GONORRHEA: NEGATIVE

## 2015-12-11 LAB — WET PREP, GENITAL
SPERM: NONE SEEN
Trich, Wet Prep: NONE SEEN
Yeast Wet Prep HPF POC: NONE SEEN

## 2015-12-11 LAB — PREGNANCY, URINE: PREG TEST UR: NEGATIVE

## 2015-12-11 MED ORDER — HYDROCODONE-ACETAMINOPHEN 5-325 MG PO TABS: 1.0000 | ORAL_TABLET | Freq: Four times a day (QID) | ORAL | Status: DC | PRN

## 2015-12-11 MED ORDER — CEFTRIAXONE SODIUM 250 MG IJ SOLR
250.0000 mg | Freq: Once | INTRAMUSCULAR | Status: AC
  Administered 2015-12-11: 250 mg via INTRAMUSCULAR
  Filled 2015-12-11: qty 250

## 2015-12-11 MED ORDER — HYDROCODONE-ACETAMINOPHEN 5-325 MG PO TABS
1.0000 | ORAL_TABLET | Freq: Once | ORAL | Status: AC
Start: 2015-12-11 — End: 2015-12-11
  Administered 2015-12-11: 1 via ORAL
  Filled 2015-12-11: qty 1

## 2015-12-11 MED ORDER — AZITHROMYCIN 250 MG PO TABS
1000.0000 mg | ORAL_TABLET | Freq: Once | ORAL | Status: AC
Start: 2015-12-11 — End: 2015-12-11
  Administered 2015-12-11: 1000 mg via ORAL
  Filled 2015-12-11: qty 4

## 2015-12-11 MED ORDER — FLUCONAZOLE 100 MG PO TABS
200.0000 mg | ORAL_TABLET | Freq: Once | ORAL | Status: AC
  Administered 2015-12-11: 200 mg via ORAL
  Filled 2015-12-11: qty 2

## 2015-12-11 NOTE — Discharge Instructions (Signed)

## 2015-12-11 NOTE — ED Provider Notes (Signed)
CSN: [id number]     Arrival date & time 12/10/15  2021 History  By signing my name below, I, Iona Beard, attest that this documentation has been prepared under the direction and in the presence of Gwyneth Sprout, MD.   Electronically Signed: Iona Beard, ED Scribe. 12/11/2015. 3:01 AM   Chief Complaint  Patient presents with  . Abdominal Pain  . Rectal Pain    The history is provided by the patient. No language interpreter was used.   HPI Comments: Sonya Alexander is a 20 y.o. female with PMHx of GERD and FMHx of ovarian cancer who presents to the Emergency Department complaining of gradual onset, constant, moderate, 7/10 lower abdominal pain, onset earlier today around 7 PM. Pt also complains of rectal pain that radiates into her abdomen. She says she has not eaten today due to the pain. No worsening or alleviating factors noted. Pt denies dysuria, nausea, vomiting, hematuria, or any other pertinent symptoms. Does note some vaginal itching and minimal discharge.  Last coitus was 2 days ago and non-tender.  Pt has not bowel movement today and her LNMP was about a month ago.     Past Medical History  Diagnosis Date  . GERD (gastroesophageal reflux disease)   . Asthma    Past Surgical History  Procedure Laterality Date  . Tonsillectomy    . Ankle fracture surgery    . Tubes in ears     History reviewed. No pertinent family history. Social History  Substance Use Topics  . Smoking status: Never Smoker   . Smokeless tobacco: None  . Alcohol Use: No   OB History    No data available     Review of Systems A complete 10 system review of systems was obtained and all systems are negative except as noted in the HPI and PMH.     Allergies  Review of patient's allergies indicates no known allergies.  Home Medications   Prior to Admission medications   Medication Sig Start Date End Date Taking? Authorizing Provider  albuterol (PROVENTIL HFA;VENTOLIN HFA) 108 (90  BASE) MCG/ACT inhaler Inhale 2 puffs into the lungs every 6 (six) hours as needed for wheezing or shortness of breath.    Historical Provider, MD  etonogestrel (NEXPLANON) 68 MG IMPL implant Inject 1 each into the skin once.    Historical Provider, MD   BP 122/64 mmHg  Pulse 77  Temp(Src) 98.9 F (37.2 C) (Oral)  Resp 16  Ht  (1.702 m)  Wt 151 lb 11.2 oz (68.811 kg)  BMI 23.75 kg/m2  SpO2 100% Physical Exam  Constitutional: She is oriented to person, place, and time. She appears well-developed and well-nourished. No distress.  HENT:  Head: Normocephalic and atraumatic.  Eyes: EOM are normal.  Neck: Normal range of motion.  Cardiovascular: Normal rate, regular rhythm and normal heart sounds.   Pulmonary/Chest: Effort normal and breath sounds normal.  Abdominal: Soft. She exhibits no distension. There is tenderness.  Suprapubic TTP.   Genitourinary:  Normal rectal.  No hemorrhoids or lesions.   Copius amounts of curd-like discharge. Irritation of vaginal walls. Mild bleeding from cervix. No adnexal or cervical motion tenderness.   Musculoskeletal: Normal range of motion.  Neurological: She is alert and oriented to person, place, and time.  Skin: Skin is warm and dry.  Psychiatric: She has a normal mood and affect. Judgment normal.  Nursing note and vitals reviewed.   ED Course  Procedures (including critical care time) DIAGNOSTIC STUDIES: Oxygen  Saturation is 100% on RA, normal by my interpretation.    COORDINATION OF CARE: 2:26 AM-Discussed treatment plan which includes CMP, CBC, urinalysis, and pelvic exam with pt at bedside and pt agreed to plan.    Labs Review Labs Reviewed  COMPREHENSIVE METABOLIC PANEL - Abnormal; Notable for the following:    Total Protein 5.9 (*)    ALT 11 (*)    All other components within normal limits  LIPASE, BLOOD  CBC  URINALYSIS, ROUTINE W REFLEX MICROSCOPIC (NOT AT [facility])  PREGNANCY, URINE    Imaging Review No results  found. I have personally reviewed and evaluated these lab results as part of my medical decision-making.   EKG Interpretation None      MDM   Final diagnoses:  Candida vaginitis  Suprapubic pain, acute, unspecified laterality   Patient presenting with a suprapubic discomfort that started earlier today with intermittent bouts of severe cramping pain that radiated into the rectum. No nausea, vomiting or diarrhea with symptoms. She denies any urinary symptoms but has noted some mild vaginal discharge. Currently patient's pain is better than earlier in the day. Exam she has suprapubic tenderness but no other abdominal pain. Low suspicion for appendicitis, diverticulitis, pancreatitis or cholecystitis. On pelvic exam patient has no adnexal or cervical motion tenderness but does have copious amounts of white discharge that looks most like yeast. She is sexually active and does not use protection every time. She has been with this partner for 4 months. Low suspicion for TOA, PID or ovarian torsion. She has no adnexal tenderness.  CBC, CMP, UA are all within normal limits. UPT is negative. Lipase normal. Wet prep shows many white blood cells and clue cells.   Patient will be treated for you stent also given Rocephin and azithromycin for possible STI. At this time there is no indication for further imaging however gave patient strict return precautions for worsening pain, fever, nausea or vomiting.  I personally performed the services described in this documentation, which was scribed in my presence.  The recorded information has been reviewed and considered.     Gwyneth Sprout, MD 12/11/15 [phone]

## 2016-01-28 ENCOUNTER — Encounter (HOSPITAL_COMMUNITY): Payer: Self-pay | Admitting: *Deleted

## 2016-01-28 ENCOUNTER — Emergency Department (HOSPITAL_COMMUNITY)
Admission: EM | Admit: 2016-01-28 | Discharge: 2016-01-28 | Disposition: A | Discharge status: Home / Self Care | Payer: BLUE CROSS/BLUE SHIELD | Attending: Emergency Medicine | Admitting: Emergency Medicine

## 2016-01-28 DIAGNOSIS — R300 Dysuria: Secondary | ICD-10-CM | POA: Diagnosis present

## 2016-01-28 DIAGNOSIS — Z8719 Personal history of other diseases of the digestive system: Secondary | ICD-10-CM | POA: Diagnosis not present

## 2016-01-28 DIAGNOSIS — Z3202 Encounter for pregnancy test, result negative: Secondary | ICD-10-CM | POA: Insufficient documentation

## 2016-01-28 DIAGNOSIS — N390 Urinary tract infection, site not specified: Secondary | ICD-10-CM | POA: Diagnosis not present

## 2016-01-28 DIAGNOSIS — Z79899 Other long term (current) drug therapy: Secondary | ICD-10-CM | POA: Diagnosis not present

## 2016-01-28 DIAGNOSIS — J45909 Unspecified asthma, uncomplicated: Secondary | ICD-10-CM | POA: Insufficient documentation

## 2016-01-28 LAB — URINALYSIS, ROUTINE W REFLEX MICROSCOPIC
Bilirubin Urine: NEGATIVE
Glucose, UA: NEGATIVE mg/dL
Ketones, ur: 15 mg/dL — AB
Nitrite: NEGATIVE
PROTEIN: 30 mg/dL — AB
Specific Gravity, Urine: 1.023 (ref 1.005–1.030)
pH: 7 (ref 5.0–8.0)

## 2016-01-28 LAB — URINE MICROSCOPIC-ADD ON

## 2016-01-28 LAB — POC URINE PREG, ED: Preg Test, Ur: NEGATIVE

## 2016-01-28 MED ORDER — NITROFURANTOIN MONOHYD MACRO 100 MG PO CAPS: 100.0000 mg | ORAL_CAPSULE | Freq: Two times a day (BID) | ORAL | Status: DC

## 2016-01-28 NOTE — ED Notes (Signed)
Pt has been sleeping in the waiting room since check in. Came up to nurse first agitated about wait time. Pt was informed of longer wait times due to pt acuity and she will be roomed when rooms became available. Pt now stating "i'm about to pass out". Pt in NAD or SOB noted. RN notified.

## 2016-01-28 NOTE — ED Provider Notes (Signed)
CSN: [id number]     Arrival date & time 01/28/16  0049 History [id number]  First MD Initiated Contact with Patient 01/28/16 [phone]     Chief Complaint  Patient presents with  . Urinary Tract Infection     (Consider location/radiation/quality/duration/timing/severity/associated sxs/prior Treatment) HPI Comments: SUBJECTIVE: Sonya Alexander is a 20 y.o. female who complains of urinary frequency, urgency and dysuria x 2-3 days, without flank pain, fever, chills, or abnormal vaginal discharge or bleeding.  Hx of UTI. Taking AZO right now. Pt is sure that she is not pregnant. In a monogamous relationship with her boyfriend.  OBJECTIVE: Appears well, in no apparent distress.  Vital signs are normal. The abdomen is soft without tenderness, guarding, mass, rebound or organomegaly. No CVA tenderness or inguinal adenopathy noted. Urine dipstick shows positive for WBC's.  Micro exam: WBC's per HPF and + bacteria.     Patient is a 20 y.o. female presenting with urinary tract infection. The history is provided by the patient.  Urinary Tract Infection Associated symptoms: no abdominal pain, no fever and no nausea     Past Medical History  Diagnosis Date  . GERD (gastroesophageal reflux disease)   . Asthma    Past Surgical History  Procedure Laterality Date  . Tonsillectomy    . Ankle fracture surgery    . Tubes in ears     No family history on file. Social History  Substance Use Topics  . Smoking status: Never Smoker   . Smokeless tobacco: None  . Alcohol Use: No   OB History    No data available     Review of Systems  Constitutional: Positive for activity change. Negative for fever.  Gastrointestinal: Negative for nausea and abdominal pain.  Genitourinary: Positive for dysuria.      Allergies  Review of patient's allergies indicates no known allergies.  Home Medications   Prior to Admission medications   Medication Sig Start Date End Date Taking? Authorizing Provider  etonogestrel  (NEXPLANON) 68 MG IMPL implant Inject 1 each into the skin once.   Yes Historical Provider, MD  Phenazopyridine HCl (AZO TABS PO) Take 1 tablet by mouth every 4 (four) hours as needed (pain).   Yes Historical Provider, MD  nitrofurantoin, macrocrystal-monohydrate, (MACROBID) 100 MG capsule Take 1 capsule (100 mg total) by mouth 2 (two) times daily. 01/28/16   Faithlynn Deeley, MD   BP 128/78 mmHg  Pulse 85  Temp(Src) 98.9 F (37.2 C) (Oral)  Resp 16  SpO2 97% Physical Exam  Constitutional: She is oriented to person, place, and time. She appears well-developed.  HENT:  Head: Normocephalic and atraumatic.  Eyes: EOM are normal.  Neck: Normal range of motion. Neck supple.  Cardiovascular: Normal rate.   Pulmonary/Chest: Effort normal.  Abdominal: Bowel sounds are normal. There is no tenderness.  Neurological: She is alert and oriented to person, place, and time.  Skin: Skin is warm and dry.  Nursing note and vitals reviewed.   ED Course  Procedures (including critical care time) Labs Review Labs Reviewed  URINALYSIS, ROUTINE W REFLEX MICROSCOPIC (NOT AT [facility]) - Abnormal; Notable for the following:    APPearance TURBID (*)    Hgb urine dipstick LARGE (*)    Ketones, ur 15 (*)    Protein, ur 30 (*)    Leukocytes, UA MODERATE (*)    All other components within normal limits  URINE MICROSCOPIC-ADD ON - Abnormal; Notable for the following:    Squamous Epithelial / LPF 0-5 (*)  Bacteria, UA MANY (*)    All other components within normal limits  POC URINE PREG, ED    Imaging Review No results found. I have personally reviewed and evaluated these images and lab results as part of my medical decision-making.   EKG Interpretation None      MDM   Final diagnoses:  UTI (lower urinary tract infection)     ASSESSMENT: UTI uncomplicated without evidence of pyelonephritis  PLAN: Treatment per orders - also push fluids, may use Pyridium OTC prn. Call or return to clinic prn if  these symptoms worsen or fail to improve as anticipated.    Derwood Kaplan, MD 01/28/16 [phone]

## 2016-01-28 NOTE — Discharge Instructions (Signed)

## 2016-01-28 NOTE — ED Notes (Signed)
Pt states that she has had blood in her urine and burning when she's urinating. Concerned that she has a UTI.

## 2016-10-05 ENCOUNTER — Emergency Department (HOSPITAL_COMMUNITY): Payer: BLUE CROSS/BLUE SHIELD

## 2016-10-05 ENCOUNTER — Encounter (HOSPITAL_COMMUNITY): Payer: Self-pay | Admitting: Neurology

## 2016-10-05 ENCOUNTER — Other Ambulatory Visit (HOSPITAL_COMMUNITY): Payer: BLUE CROSS/BLUE SHIELD

## 2016-10-05 ENCOUNTER — Emergency Department (HOSPITAL_COMMUNITY)
Admission: EM | Admit: 2016-10-05 | Discharge: 2016-10-05 | Disposition: A | Discharge status: Home / Self Care | Payer: BLUE CROSS/BLUE SHIELD | Attending: Emergency Medicine | Admitting: Emergency Medicine

## 2016-10-05 DIAGNOSIS — J45909 Unspecified asthma, uncomplicated: Secondary | ICD-10-CM | POA: Insufficient documentation

## 2016-10-05 DIAGNOSIS — R102 Pelvic and perineal pain: Secondary | ICD-10-CM

## 2016-10-05 DIAGNOSIS — R1031 Right lower quadrant pain: Secondary | ICD-10-CM

## 2016-10-05 LAB — COMPREHENSIVE METABOLIC PANEL
ALK PHOS: 73 U/L (ref 38–126)
ALT: 10 U/L — ABNORMAL LOW (ref 14–54)
AST: 17 U/L (ref 15–41)
Albumin: 4.1 g/dL (ref 3.5–5.0)
Anion gap: 7 (ref 5–15)
BUN: 5 mg/dL — AB (ref 6–20)
CALCIUM: 9.6 mg/dL (ref 8.9–10.3)
CHLORIDE: 106 mmol/L (ref 101–111)
CO2: 24 mmol/L (ref 22–32)
CREATININE: 0.7 mg/dL (ref 0.44–1.00)
GFR calc non Af Amer: >60 mL/min (ref >60)
GLUCOSE: 94 mg/dL (ref 65–99)
Potassium: 4 mmol/L (ref 3.5–5.1)
SODIUM: 137 mmol/L (ref 135–145)
Total Bilirubin: 0.8 mg/dL (ref 0.3–1.2)
Total Protein: 7 g/dL (ref 6.5–8.1)

## 2016-10-05 LAB — CBC
HCT: 43 % (ref 36.0–46.0)
Hemoglobin: 14.3 g/dL (ref 12.0–15.0)
MCH: 31.4 pg (ref 26.0–34.0)
MCHC: 33.3 g/dL (ref 30.0–36.0)
MCV: 94.5 fL (ref 78.0–100.0)
PLATELETS: 268 10*3/uL (ref 150–400)
RBC: 4.55 MIL/uL (ref 3.87–5.11)
RDW: 13.1 % (ref 11.5–15.5)
WBC: 8.5 10*3/uL (ref 4.0–10.5)

## 2016-10-05 LAB — URINALYSIS, ROUTINE W REFLEX MICROSCOPIC
BILIRUBIN URINE: NEGATIVE
GLUCOSE, UA: NEGATIVE mg/dL
HGB URINE DIPSTICK: NEGATIVE
KETONES UR: NEGATIVE mg/dL
Leukocytes, UA: NEGATIVE
Nitrite: NEGATIVE
PROTEIN: NEGATIVE mg/dL
Specific Gravity, Urine: 1.004 — ABNORMAL LOW (ref 1.005–1.030)
pH: 8 (ref 5.0–8.0)

## 2016-10-05 LAB — I-STAT BETA HCG BLOOD, ED (MC, WL, AP ONLY)

## 2016-10-05 LAB — LIPASE, BLOOD: LIPASE: 15 U/L (ref 11–51)

## 2016-10-05 MED ORDER — NAPROXEN 500 MG PO TABS: 500.0000 mg | ORAL_TABLET | Freq: Two times a day (BID) | ORAL | 0 refills | Status: DC

## 2016-10-05 MED ORDER — HYDROCODONE-ACETAMINOPHEN 5-325 MG PO TABS: 1.0000 | ORAL_TABLET | ORAL | 0 refills | Status: DC | PRN

## 2016-10-05 MED ORDER — KETOROLAC TROMETHAMINE 60 MG/2ML IM SOLN
60.0000 mg | Freq: Once | INTRAMUSCULAR | Status: AC
  Administered 2016-10-05: 60 mg via INTRAMUSCULAR
  Filled 2016-10-05: qty 2

## 2016-10-05 NOTE — ED Notes (Signed)
Patient in u/s.

## 2016-10-05 NOTE — ED Triage Notes (Signed)
Pt reports RLQ abd pain x 3 days, dx with UTI yesterday at urgent care. Denies v/d. Is taking cipro.

## 2016-10-05 NOTE — ED Provider Notes (Signed)
[facility] Provider Note   CSN: [id number] Arrival date & time: 10/05/16  1141  By signing my name below, I, Rosario AdieWilliam Andrew Hiatt, attest that this documentation has been prepared under the direction and in the presence of Rolland PorterMark Tynell Winchell, MD. Electronically Signed: Rosario AdieWilliam Andrew Hiatt, ED Scribe. 10/05/16. 1:49 PM.  History   Chief Complaint Chief Complaint  Patient presents with  . Abdominal Pain   The history is provided by the patient. No language interpreter was used.    HPI Comments: Sonya Alexander is a 380P0 20 y.o. female with a PMHx of asthma and GERD, who presents to the Emergency Department complaining of intermittent, right-sided lower abdominal pain onset approximately 3 days ago. Pt notes occasional radiation downwards towards her right knee. She describes her pain as shooting and sometimes burning. Pt notes associated nausea and loss of appetite secondary to the onset of her abdominal pain. Pt was seen by UC for her symptoms yesterday, and at that time she was dx'd with a UTI. She was rx'd Ciprofloxacin which she began taking without relief. Pt notes that she has a h/o frequent UTIs, and notes that one year ago she was seen in the ED for similar abdominal pain and was dx'd with a UTI at that time. She additionally reports that she began her menstrual cycle four days ago, however, states that this has been irregular as she has been irregularly bleeding since her cycle began. Pt currently has Nexplanon implantation, which has been placed for two years. She denies her abdominal pain being similar to pain associated with her prior typical menstrual cycles. No recent trauma, injury, or exercise routines to precipitate her pain. She has a FHx, but not a personal h/o ovarian cysts. She denies vomiting, dysuria, frequency, hematuria, or any other associated symptoms.   Past Medical History:  Diagnosis Date  . Asthma   . GERD (gastroesophageal reflux disease)    There are no  active problems to display for this patient.  Past Surgical History:  Procedure Laterality Date  . ANKLE FRACTURE SURGERY    . TONSILLECTOMY    . tubes in ears     OB History    No data available     Home Medications    Prior to Admission medications   Medication Sig Start Date End Date Taking? Authorizing Provider  etonogestrel (NEXPLANON) 68 MG IMPL implant Inject 1 each into the skin once.    Historical Provider, MD  HYDROcodone-acetaminophen (NORCO/VICODIN) 5-325 MG tablet Take 1 tablet by mouth every 4 (four) hours as needed. 10/05/16   Rolland PorterMark Josselyne Onofrio, MD  naproxen (NAPROSYN) 500 MG tablet Take 1 tablet (500 mg total) by mouth 2 (two) times daily. 10/05/16   Rolland PorterMark Chayil Gantt, MD  nitrofurantoin, macrocrystal-monohydrate, (MACROBID) 100 MG capsule Take 1 capsule (100 mg total) by mouth 2 (two) times daily. 01/28/16   Derwood KaplanAnkit Nanavati, MD  Phenazopyridine HCl (AZO TABS PO) Take 1 tablet by mouth every 4 (four) hours as needed (pain).    Historical Provider, MD   Family History No family history on file.  Social History Social History  Substance Use Topics  . Smoking status: Never Smoker  . Smokeless tobacco: Not on file  . Alcohol use Yes   Allergies   Patient has no known allergies.  Review of Systems Review of Systems  Constitutional: Positive for appetite change. Negative for chills, diaphoresis, fatigue and fever.  HENT: Negative for mouth sores, sore throat and trouble swallowing.   Eyes: Negative for visual  disturbance.  Respiratory: Negative for cough, chest tightness, shortness of breath and wheezing.   Cardiovascular: Negative for chest pain.  Gastrointestinal: Positive for abdominal pain and nausea. Negative for abdominal distention, diarrhea and vomiting.  Endocrine: Negative for polydipsia, polyphagia and polyuria.  Genitourinary: Negative for dysuria, frequency and hematuria.  Musculoskeletal: Negative for gait problem.  Skin: Negative for color change, pallor and  rash.  Neurological: Negative for dizziness, syncope, light-headedness and headaches.  Hematological: Does not bruise/bleed easily.  Psychiatric/Behavioral: Negative for behavioral problems and confusion.  All other systems reviewed and are negative.  Physical Exam Updated Vital Signs BP 109/60 (BP Location: Right Arm)   Pulse 85   Temp 98.9 F (37.2 C) (Oral)   Resp 18   Ht 5\' 7"  (1.702 m)   Wt 155 lb (70.3 kg)   SpO2 100%   BMI 24.28 kg/m   Physical Exam  Constitutional: She is oriented to person, place, and time. She appears well-developed and well-nourished. No distress.  HENT:  Head: Normocephalic.  Eyes: Conjunctivae are normal. Pupils are equal, round, and reactive to light. No scleral icterus.  Neck: Normal range of motion. Neck supple. No thyromegaly present.  Cardiovascular: Normal rate and regular rhythm.  Exam reveals no gallop and no friction rub.   No murmur heard. Pulmonary/Chest: Effort normal and breath sounds normal. No respiratory distress. She has no wheezes. She has no rales.  Abdominal: Soft. Bowel sounds are normal. She exhibits no distension. There is tenderness. There is no rebound.  TTP over the RLQ and right-sided pelvis.   Musculoskeletal: Normal range of motion.  Neurological: She is alert and oriented to person, place, and time.  Skin: Skin is warm and dry. No rash noted.  Psychiatric: She has a normal mood and affect. Her behavior is normal.   ED Treatments / Results  DIAGNOSTIC STUDIES: Oxygen Saturation is 98% on RA, normal by my interpretation.   COORDINATION OF CARE: 1:03 PM-Discussed next steps with pt. Pt verbalized understanding and is agreeable with the plan.   Labs (all labs ordered are listed, but only abnormal results are displayed) Labs Reviewed  COMPREHENSIVE METABOLIC PANEL - Abnormal; Notable for the following:       Result Value   BUN 5 (*)    ALT 10 (*)    All other components within normal limits  URINALYSIS, ROUTINE  W REFLEX MICROSCOPIC - Abnormal; Notable for the following:    Color, Urine STRAW (*)    Specific Gravity, Urine 1.004 (*)    All other components within normal limits  LIPASE, BLOOD  CBC  I-STAT BETA HCG BLOOD, ED ([facility], [facility], [facility] ONLY)   EKG  EKG Interpretation None      Radiology US Transvaginal Non-ob  Result Date: 10/05/2016 CLINICAL DATA:  Right lower quadrant pain for 3 days EXAM: TRANSABDOMINAL AND TRANSVAGINAL ULTRASOUND OF PELVIS DOPPLER ULTRASOUND OF OVARIES TECHNIQUE: Both transabdominal and transvaginal ultrasound examinations of the pelvis were performed. Transabdominal technique was performed for global imaging of the pelvis including uterus, ovaries, adnexal regions, and pelvic cul-de-sac. It was necessary to proceed with endovaginal exam following the transabdominal exam to visualize the uterus, endometrium and ovaries. Color and duplex Doppler ultrasound was utilized to evaluate blood flow to the ovaries. COMPARISON:  05/10/2014 FINDINGS: Uterus Measurements: 7.3 x 3.1 x 3.9 cm. No fibroids or other mass visualized. Endometrium Thickness: 3.5 mm.  No focal abnormality visualized. Right ovary Measurements: 4.1 x 2.9 x 3.5 cm. Normal appearance/no adnexal mass. Left ovary  Measurements: 4.7 x 2.7 x 3.1 cm. Normal appearance/no adnexal mass. Pulsed Doppler evaluation of both ovaries demonstrates normal low-resistance arterial and venous waveforms. Other findings No abnormal free fluid. IMPRESSION: Normal pelvic ultrasound.  No evidence for ovarian torsion. Electronically Signed   By: Jasmine PangKim  Fujinaga M.D.   On: 10/05/2016 14:35   [place] Pelvis Complete  Result Date: 10/05/2016 CLINICAL DATA:  Right lower quadrant pain for 3 days EXAM: TRANSABDOMINAL AND TRANSVAGINAL ULTRASOUND OF PELVIS DOPPLER ULTRASOUND OF OVARIES TECHNIQUE: Both transabdominal and transvaginal ultrasound examinations of the pelvis were performed. Transabdominal technique was performed for global imaging of the pelvis  including uterus, ovaries, adnexal regions, and pelvic cul-de-sac. It was necessary to proceed with endovaginal exam following the transabdominal exam to visualize the uterus, endometrium and ovaries. Color and duplex Doppler ultrasound was utilized to evaluate blood flow to the ovaries. COMPARISON:  05/10/2014 FINDINGS: Uterus Measurements: 7.3 x 3.1 x 3.9 cm. No fibroids or other mass visualized. Endometrium Thickness: 3.5 mm.  No focal abnormality visualized. Right ovary Measurements: 4.1 x 2.9 x 3.5 cm. Normal appearance/no adnexal mass. Left ovary Measurements: 4.7 x 2.7 x 3.1 cm. Normal appearance/no adnexal mass. Pulsed Doppler evaluation of both ovaries demonstrates normal low-resistance arterial and venous waveforms. Other findings No abnormal free fluid. IMPRESSION: Normal pelvic ultrasound.  No evidence for ovarian torsion. Electronically Signed   By: Jasmine PangKim  Fujinaga M.D.   On: 10/05/2016 14:35   [place] Art/ven Flow Abd Pelv Doppler  Result Date: 10/05/2016 CLINICAL DATA:  Right lower quadrant pain for 3 days EXAM: TRANSABDOMINAL AND TRANSVAGINAL ULTRASOUND OF PELVIS DOPPLER ULTRASOUND OF OVARIES TECHNIQUE: Both transabdominal and transvaginal ultrasound examinations of the pelvis were performed. Transabdominal technique was performed for global imaging of the pelvis including uterus, ovaries, adnexal regions, and pelvic cul-de-sac. It was necessary to proceed with endovaginal exam following the transabdominal exam to visualize the uterus, endometrium and ovaries. Color and duplex Doppler ultrasound was utilized to evaluate blood flow to the ovaries. COMPARISON:  05/10/2014 FINDINGS: Uterus Measurements: 7.3 x 3.1 x 3.9 cm. No fibroids or other mass visualized. Endometrium Thickness: 3.5 mm.  No focal abnormality visualized. Right ovary Measurements: 4.1 x 2.9 x 3.5 cm. Normal appearance/no adnexal mass. Left ovary Measurements: 4.7 x 2.7 x 3.1 cm. Normal appearance/no adnexal mass. Pulsed Doppler  evaluation of both ovaries demonstrates normal low-resistance arterial and venous waveforms. Other findings No abnormal free fluid. IMPRESSION: Normal pelvic ultrasound.  No evidence for ovarian torsion. Electronically Signed   By: Jasmine PangKim  Fujinaga M.D.   On: 10/05/2016 14:35    Procedures Procedures  Medications Ordered in ED Medications  ketorolac (TORADOL) injection 60 mg (60 mg Intramuscular Given 10/05/16 1317)    Initial Impression / Assessment and Plan / ED Course  I have reviewed the triage vital signs and the nursing notes.  Pertinent labs & imaging results that were available during my care of the patient were reviewed by me and considered in my medical decision making (see chart for details).  Clinical Course      Final Clinical Impressions(s) / ED Diagnoses   Final diagnoses:  RLQ abdominal pain  Pelvic pain in female   New Prescriptions New Prescriptions   HYDROCODONE-ACETAMINOPHEN (NORCO/VICODIN) 5-325 MG TABLET    Take 1 tablet by mouth every 4 (four) hours as needed.   NAPROXEN (NAPROSYN) 500 MG TABLET    Take 1 tablet (500 mg total) by mouth 2 (two) times daily.    Negative Ua, HCG, and US. Pt declines  pelvic exam.  Not concerned RE STD. Plan Dc c Naproxen and RTER prn.   Rolland Porter, MD 10/05/16 [phone]

## 2016-10-05 NOTE — ED Notes (Signed)
Patient returned from US.

## 2016-10-05 NOTE — Discharge Instructions (Signed)
Return to ER with any new or worsening symptoms. °

## 2017-06-14 IMAGING — US US PELVIS COMPLETE
1 series · 13 of 25 positions shown · non-contrast
Comparison: 05/10/2014

CLINICAL DATA: Right lower quadrant pain for 3 days

EXAM:
TRANSABDOMINAL AND TRANSVAGINAL ULTRASOUND OF PELVIS
DOPPLER ULTRASOUND OF OVARIES
TECHNIQUE: Both transabdominal and transvaginal ultrasound examinations of the
pelvis were performed. Transabdominal technique was performed for
global imaging of the pelvis including uterus, ovaries, adnexal
regions, and pelvic cul-de-sac.
It was necessary to proceed with endovaginal exam following the
transabdominal exam to visualize the uterus, endometrium and
ovaries. Color and duplex Doppler ultrasound was utilized to
evaluate blood flow to the ovaries.

[Series 1: us pelvis complete · 0.18mm/px · 13 of 75 slices shown]
[im 1/75]
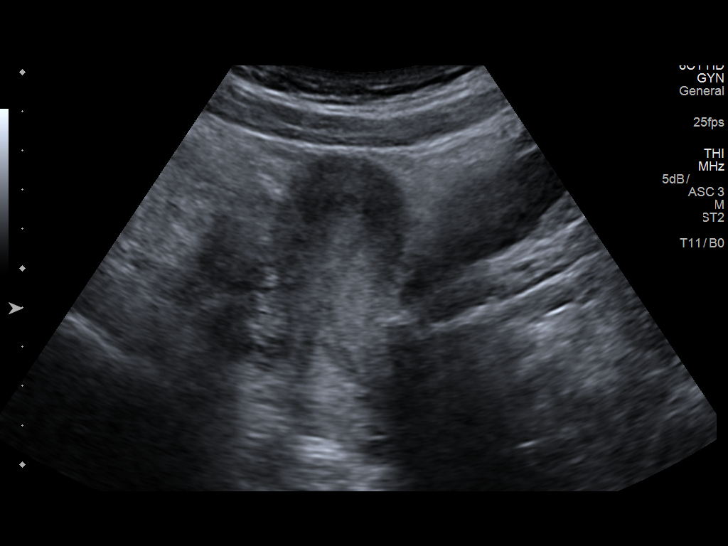
[im 7/75]
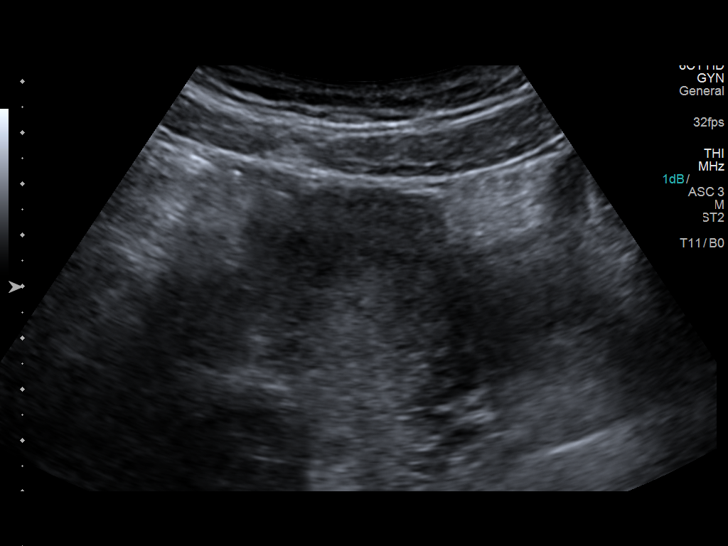
[im 13/75]
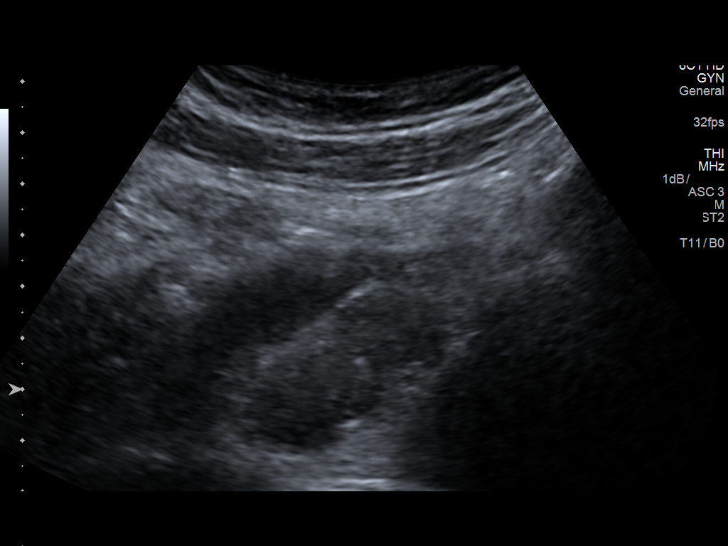
[im 19/75]
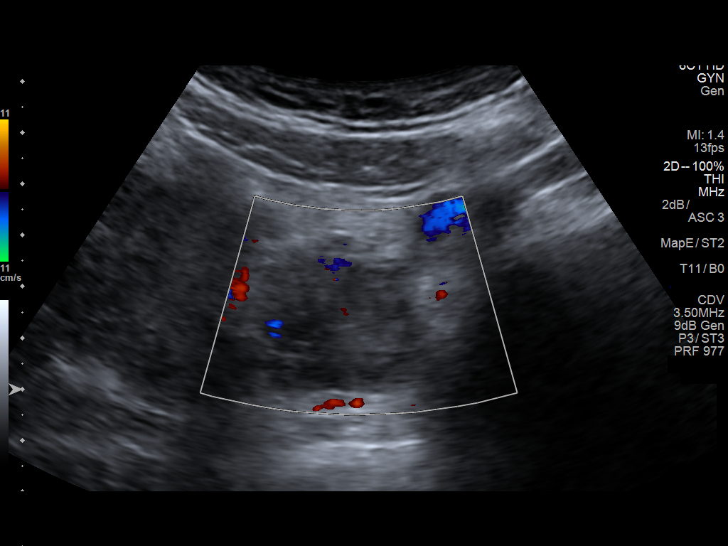
[im 25/75]
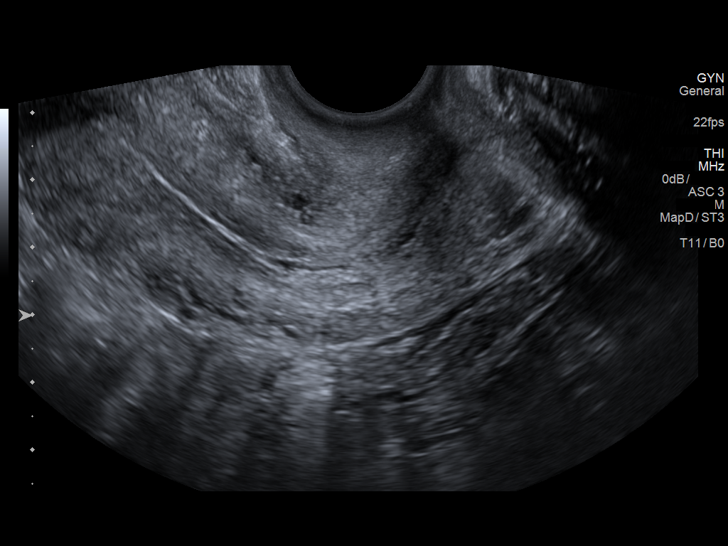
[im 31/75]
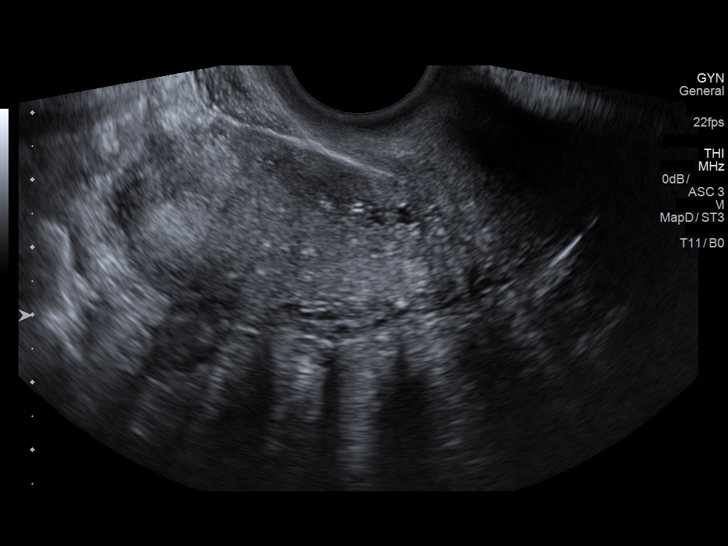
[im 38/75]
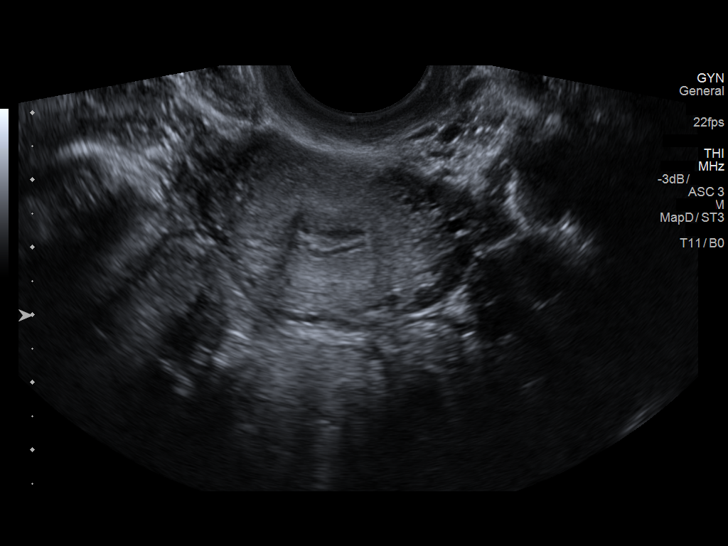
[im 44/75]
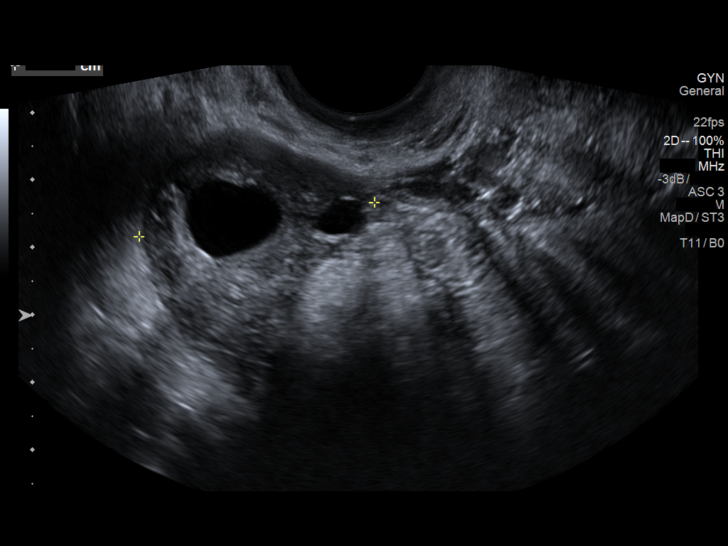
[im 50/75]
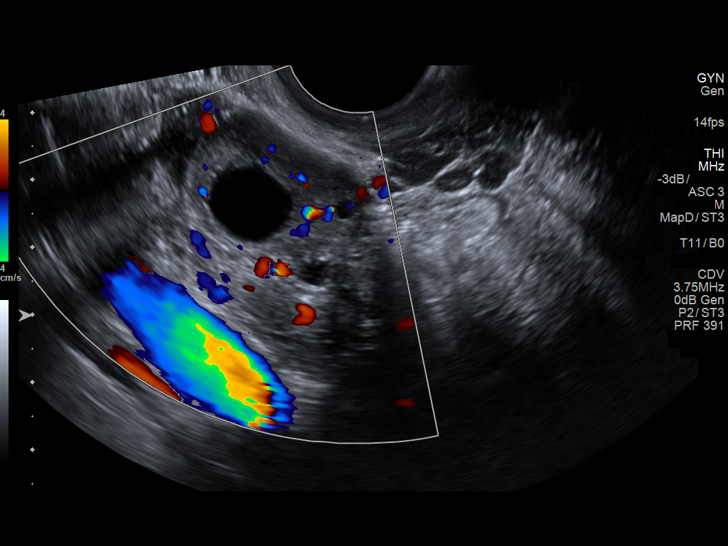
[im 56/75]
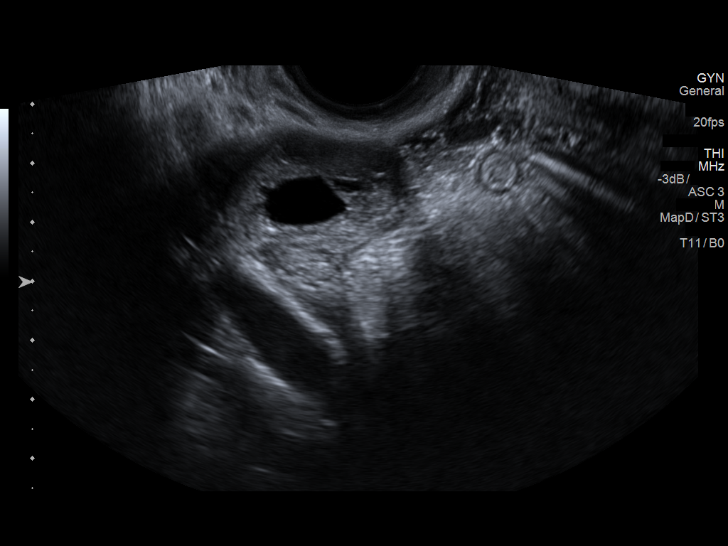
[im 62/75]
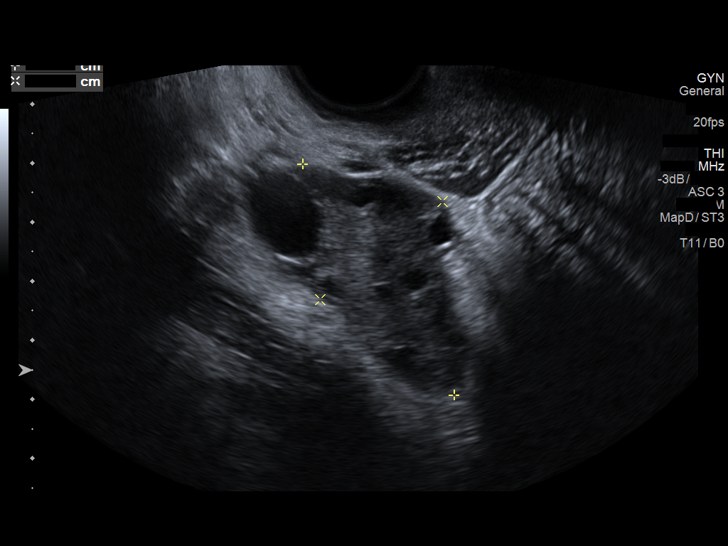
[im 68/75]
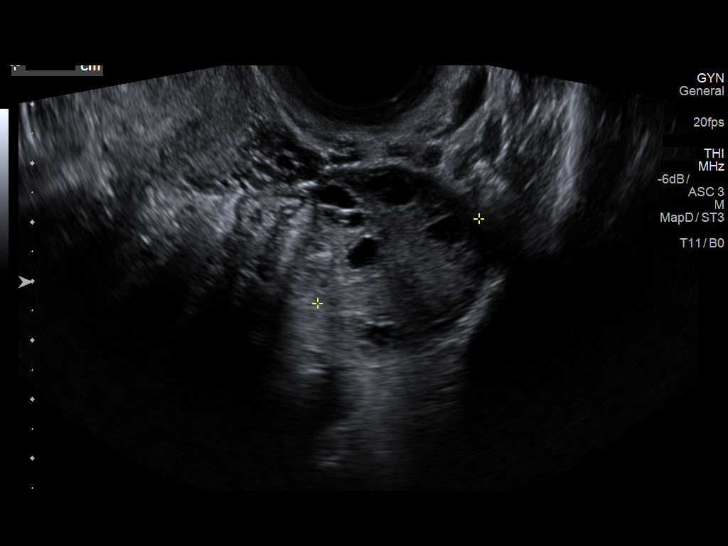
[im 75/75]
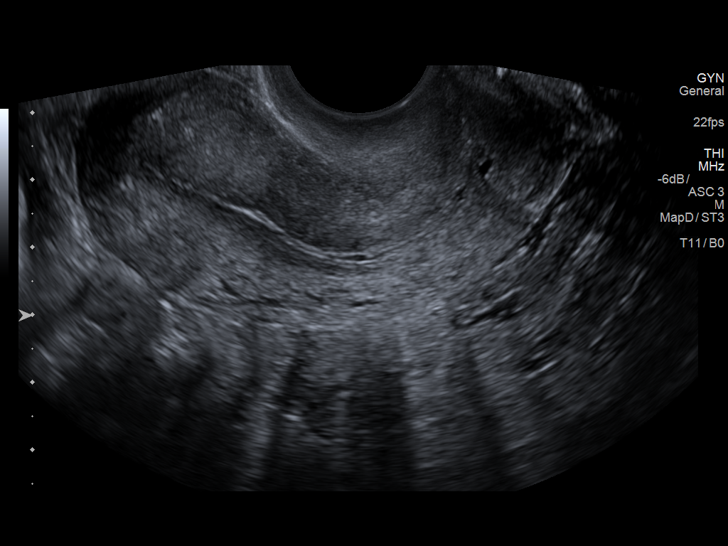

[13 of 25 positions shown; findings below may reference images not displayed]

FINDINGS: Uterus

Measurements: 7.3 x 3.1 x 3.9 cm.. No fibroids or other mass
visualized.

Endometrium

Thickness: 3.5 mm.  No focal abnormality visualized.

Right ovary

Measurements: 4.1 x 2.9 x 3.5 cm. Normal appearance/no adnexal mass.

Left ovary

Measurements: 4.7 x 2.7 x 3.1 cm. Normal appearance/no adnexal mass.

Pulsed Doppler evaluation of both ovaries demonstrates normal
low-resistance arterial and venous waveforms.

Other findings

No abnormal free fluid.
IMPRESSION: Normal pelvic ultrasound.  No evidence for ovarian torsion.

## 2017-07-10 ENCOUNTER — Emergency Department (HOSPITAL_COMMUNITY)
Admission: EM | Admit: 2017-07-10 | Discharge: 2017-07-10 | Disposition: A | Discharge status: Home / Self Care | Payer: BLUE CROSS/BLUE SHIELD | Attending: Emergency Medicine | Admitting: Emergency Medicine

## 2017-07-10 ENCOUNTER — Encounter (HOSPITAL_COMMUNITY): Payer: Self-pay | Admitting: Emergency Medicine

## 2017-07-10 ENCOUNTER — Emergency Department (HOSPITAL_COMMUNITY): Payer: BLUE CROSS/BLUE SHIELD

## 2017-07-10 DIAGNOSIS — Y929 Unspecified place or not applicable: Secondary | ICD-10-CM | POA: Diagnosis not present

## 2017-07-10 DIAGNOSIS — X500XXA Overexertion from strenuous movement or load, initial encounter: Secondary | ICD-10-CM | POA: Diagnosis not present

## 2017-07-10 DIAGNOSIS — Y939 Activity, unspecified: Secondary | ICD-10-CM | POA: Diagnosis not present

## 2017-07-10 DIAGNOSIS — S63502A Unspecified sprain of left wrist, initial encounter: Secondary | ICD-10-CM | POA: Insufficient documentation

## 2017-07-10 DIAGNOSIS — Y999 Unspecified external cause status: Secondary | ICD-10-CM | POA: Diagnosis not present

## 2017-07-10 DIAGNOSIS — Z79899 Other long term (current) drug therapy: Secondary | ICD-10-CM | POA: Diagnosis not present

## 2017-07-10 DIAGNOSIS — J45909 Unspecified asthma, uncomplicated: Secondary | ICD-10-CM | POA: Diagnosis not present

## 2017-07-10 DIAGNOSIS — S6982XA Other specified injuries of left wrist, hand and finger(s), initial encounter: Secondary | ICD-10-CM | POA: Diagnosis present

## 2017-07-10 DIAGNOSIS — R079 Chest pain, unspecified: Secondary | ICD-10-CM | POA: Diagnosis not present

## 2017-07-10 LAB — BASIC METABOLIC PANEL
Anion gap: 8 (ref 5–15)
BUN: <5 mg/dL — ABNORMAL LOW (ref 6–20)
CO2: 23 mmol/L (ref 22–32)
Calcium: 8.9 mg/dL (ref 8.9–10.3)
Chloride: 108 mmol/L (ref 101–111)
Creatinine, Ser: 0.5 mg/dL (ref 0.44–1.00)
GFR calc Af Amer: >60 mL/min (ref >60)
GFR calc non Af Amer: >60 mL/min (ref >60)
Glucose, Bld: 92 mg/dL (ref 65–99)
Potassium: 3.5 mmol/L (ref 3.5–5.1)
Sodium: 139 mmol/L (ref 135–145)

## 2017-07-10 LAB — CBC
HCT: 38.2 % (ref 36.0–46.0)
Hemoglobin: 12.5 g/dL (ref 12.0–15.0)
MCH: 31 pg (ref 26.0–34.0)
MCHC: 32.7 g/dL (ref 30.0–36.0)
MCV: 94.8 fL (ref 78.0–100.0)
Platelets: 283 10*3/uL (ref 150–400)
RBC: 4.03 MIL/uL (ref 3.87–5.11)
RDW: 14.3 % (ref 11.5–15.5)
WBC: 5.1 10*3/uL (ref 4.0–10.5)

## 2017-07-10 LAB — I-STAT TROPONIN, ED: Troponin i, poc: 0.01 ng/mL (ref 0.00–0.08)

## 2017-07-10 MED ORDER — NAPROXEN 250 MG PO TABS
500.0000 mg | ORAL_TABLET | Freq: Once | ORAL | Status: AC
  Administered 2017-07-10: 500 mg via ORAL
  Filled 2017-07-10: qty 2

## 2017-07-10 NOTE — ED Notes (Signed)
ED Provider at bedside. 

## 2017-07-10 NOTE — ED Triage Notes (Signed)
Patient with chest pain and shortness of breath that started yesterday.  Pain is sharp in nature, center of chest and radiates to the outside of her left breast.  No nausea or vomiting.  Pain got worse during the night.

## 2017-07-10 NOTE — ED Provider Notes (Signed)
[facility] Provider Note   CSN: [id number] Arrival date & time: 07/10/17  0645     History   Chief Complaint Chief Complaint  Patient presents with  . Chest Pain  . Wrist Pain    HPI Sonya Alexander is a 21 y.o. female.  HPI  20yF with CP. Onset yesterday. L chest pain. Worse with movement. Works as Child psychotherapist. 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 No respiratory complaints. No unusual leg pain or swelling. Also c/o L wrist pain. Mechanical fall and used L hand to brace self. Mild swelling.   Past Medical History:  Diagnosis Date  . Asthma   . GERD (gastroesophageal reflux disease)     There are no active problems to display for this patient.   Past Surgical History:  Procedure Laterality Date  . ANKLE FRACTURE SURGERY    . TONSILLECTOMY    . tubes in ears      OB History    No data available       Home Medications    Prior to Admission medications   Medication Sig Start Date End Date Taking? Authorizing Provider  etonogestrel (NEXPLANON) 68 MG IMPL implant Inject 1 each into the skin once.    [provider]  HYDROcodone-acetaminophen (NORCO/VICODIN) 5-325 MG tablet Take 1 tablet by mouth every 4 (four) hours as needed. 10/05/16   Rolland Porter, MD  naproxen (NAPROSYN) 500 MG tablet Take 1 tablet (500 mg total) by mouth 2 (two) times daily. 10/05/16   Rolland Porter, MD  nitrofurantoin, macrocrystal-monohydrate, (MACROBID) 100 MG capsule Take 1 capsule (100 mg total) by mouth 2 (two) times daily. 01/28/16   Derwood Kaplan, MD  Phenazopyridine HCl (AZO TABS PO) Take 1 tablet by mouth every 4 (four) hours as needed (pain).    [provider]    Family History No family history on file.  Social History Social History  Substance Use Topics  . Smoking status: Never Smoker  . Smokeless tobacco: Never Used  . Alcohol use Yes     Allergies   Patient has no known allergies.   Review of Systems Review of Systems  All systems  reviewed and negative, other than as noted in HPI.  Physical Exam Updated Vital Signs BP 130/82   Pulse 70   Temp 98.4 F (36.9 C) (Oral)   Resp 15   LMP 07/08/2017 (Exact Date)   SpO2 100%   Physical Exam  Constitutional: She appears well-developed and well-nourished. No distress.  HENT:  Head: Normocephalic and atraumatic.  Eyes: Conjunctivae are normal. Right eye exhibits no discharge. Left eye exhibits no discharge.  Neck: Neck supple.  Cardiovascular: Normal rate, regular rhythm and normal heart sounds.  Exam reveals no gallop and no friction rub.   No murmur heard. Pulmonary/Chest: Effort normal and breath sounds normal. No respiratory distress. She exhibits tenderness.  Abdominal: Soft. She exhibits no distension. There is no tenderness.  Musculoskeletal: She exhibits no edema or tenderness.  Lower extremities symmetric as compared to each other. No calf tenderness. Negative Homan's. No palpable cords.  Mild swelling distal L forearm. Able to actively range L wrist and elbow fully. NVI distally.    Neurological: She is alert.  Skin: Skin is warm and dry.  Psychiatric: She has a normal mood and affect. Her behavior is normal. Thought content normal.  Nursing note and vitals reviewed.    ED Treatments / Results  Labs (all labs ordered are listed, but only abnormal results are  displayed) Labs Reviewed  BASIC METABOLIC PANEL - Abnormal; Notable for the following:       Result Value   BUN <5 (*)    All other components within normal limits  CBC  I-STAT TROPONIN, ED    EKG  EKG Interpretation  Date/Time:  Monday July 10 2017 06:58:16 EDT Ventricular Rate:  74 PR Interval:  178 QRS Duration: 82 QT Interval:  372 QTC Calculation: 412 R Axis:   89 Text Interpretation:  Normal sinus rhythm Normal ECG Confirmed by Raeford Razor [id number]) on 07/10/2017 9:06:45 AM       Radiology Dg Chest 2 View  Result Date: 07/10/2017 CLINICAL DATA:  Chest pain. EXAM:  CHEST  2 VIEW COMPARISON:  Radiographs of April 16, 2015. FINDINGS: The heart size and mediastinal contours are within normal limits. Both lungs are clear. No pneumothorax or pleural effusion is noted. The visualized skeletal structures are unremarkable. IMPRESSION: No active cardiopulmonary disease. Electronically Signed   By: Lupita Raider, M.D.   On: 07/10/2017 07:25    Procedures Procedures (including critical care time)  Medications Ordered in ED Medications  naproxen (NAPROSYN) tablet 500 mg (500 mg Oral Given 07/10/17 1114)     Initial Impression / Assessment and Plan / ED Course  I have reviewed the triage vital signs and the nursing notes.  Pertinent labs & imaging results that were available during my care of the patient were reviewed by me and considered in my medical decision making (see chart for details).     CP likely MSK. Reproducible with movement. Doubt Serious, PE, dissection or other emergent pathology. EKG is normal appearing. Labs unremarkable. Chest x-ray without acute abnormality. Terms her wrist pain, suspect that this is sprain. She has pain with range of motion but no discrete point tenderness. She is neurovascularly intact. Velcro splint for comfort. As needed NSAIDs.  Final Clinical Impressions(s) / ED Diagnoses   Final diagnoses:  Chest pain, unspecified type  Sprain of left wrist, initial encounter    New Prescriptions New Prescriptions   No medications on file     Raeford Razor, MD 07/14/17 1446

## 2017-11-12 ENCOUNTER — Ambulatory Visit (HOSPITAL_COMMUNITY)
Admission: EM | Admit: 2017-11-12 | Discharge: 2017-11-12 | Disposition: A | Discharge status: Home / Self Care | Payer: BLUE CROSS/BLUE SHIELD | Attending: Family Medicine | Admitting: Family Medicine

## 2017-11-12 ENCOUNTER — Encounter (HOSPITAL_COMMUNITY): Payer: Self-pay | Admitting: Emergency Medicine

## 2017-11-12 DIAGNOSIS — R300 Dysuria: Secondary | ICD-10-CM

## 2017-11-12 DIAGNOSIS — N390 Urinary tract infection, site not specified: Secondary | ICD-10-CM

## 2017-11-12 DIAGNOSIS — R3915 Urgency of urination: Secondary | ICD-10-CM | POA: Diagnosis present

## 2017-11-12 DIAGNOSIS — Z3202 Encounter for pregnancy test, result negative: Secondary | ICD-10-CM | POA: Diagnosis not present

## 2017-11-12 LAB — POCT URINALYSIS DIP (DEVICE)
Bilirubin Urine: NEGATIVE
GLUCOSE, UA: 100 mg/dL — AB
KETONES UR: NEGATIVE mg/dL
Nitrite: POSITIVE — AB
Protein, ur: 100 mg/dL — AB
SPECIFIC GRAVITY, URINE: 1.025 (ref 1.005–1.030)
Urobilinogen, UA: 1 mg/dL (ref 0.0–1.0)
pH: 6 (ref 5.0–8.0)

## 2017-11-12 LAB — POCT PREGNANCY, URINE: Preg Test, Ur: NEGATIVE

## 2017-11-12 MED ORDER — PHENAZOPYRIDINE HCL 200 MG PO TABS: 200.0000 mg | ORAL_TABLET | Freq: Three times a day (TID) | ORAL | 0 refills | Status: DC

## 2017-11-12 MED ORDER — NITROFURANTOIN MONOHYD MACRO 100 MG PO CAPS: 100.0000 mg | ORAL_CAPSULE | Freq: Two times a day (BID) | ORAL | 0 refills | Status: AC

## 2017-11-12 NOTE — ED Provider Notes (Signed)
[facility]    CSN: [id number] Arrival date & time: 11/12/17  1215     History   Chief Complaint Chief Complaint  Patient presents with  . Urinary Tract Infection    HPI Sonya Alexander is a 22 y.o. female.   [name] presents with complaints of burning which urination which started last night. States has had urgency for approximately the past week. 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 wtihotu feer, back pain, abdominal pain or vaginal symptoms. LMP 1/25. Took Azo yesterday which did not help. History of asthma and gerd. Does not take daily medications.    ROS per HPI.       Past Medical History:  Diagnosis Date  . Asthma   . GERD (gastroesophageal reflux disease)     There are no active problems to display for this patient.   Past Surgical History:  Procedure Laterality Date  . ANKLE FRACTURE SURGERY    . TONSILLECTOMY    . tubes in ears      OB History    No data available       Home Medications    Prior to Admission medications   Medication Sig Start Date End Date Taking? Authorizing Provider  HYDROcodone-acetaminophen (NORCO/VICODIN) 5-325 MG tablet Take 1 tablet by mouth every 4 (four) hours as needed. 10/05/16   Rolland PorterJames, Mark, MD  naproxen (NAPROSYN) 500 MG tablet Take 1 tablet (500 mg total) by mouth 2 (two) times daily. 10/05/16   Rolland PorterJames, Mark, MD  nitrofurantoin, macrocrystal-monohydrate, (MACROBID) 100 MG capsule Take 1 capsule (100 mg total) by mouth 2 (two) times daily for 5 days. 11/12/17 11/17/17  Georgetta HaberBurky, Demond Shallenberger B, NP  phenazopyridine (PYRIDIUM) 200 MG tablet Take 1 tablet (200 mg total) by mouth 3 (three) times daily. 11/12/17   Georgetta HaberBurky, Yosselyn Tax B, NP    Family History No family history on file.  Social History Social History   Tobacco Use  . Smoking status: Never Smoker  . Smokeless tobacco: Never Used  Substance Use Topics  .  Alcohol use: Yes  . Drug use: No     Allergies   Patient has no known allergies.   Review of Systems Review of Systems   Physical Exam Triage Vital Signs ED Triage Vitals [11/12/17 1254]  Enc Vitals Group     BP 130/72     Pulse Rate 77     Resp 16     Temp 98.6 F (37 C)     Temp Source Oral     SpO2 100 %     Weight 162 lb (73.5 kg)     Height 5\' 6"  (1.676 m)     Head Circumference      Peak Flow      Pain Score 7     Pain Loc      Pain Edu?      Excl. in GC?    No data found.  Updated Vital Signs BP 130/72   Pulse 77   Temp 98.6 F (37 C) (Oral)   Resp 16   Ht 5\' 6"  (1.676 m)   Wt 162 lb (73.5 kg)   LMP 11/10/2017   SpO2 100%   BMI 26.15 kg/m   Visual Acuity Right Eye Distance:   Left Eye Distance:   Bilateral Distance:    Right Eye Near:   Left Eye Near:  Bilateral Near:     Physical Exam  Constitutional: She is oriented to person, place, and time. She appears well-developed and well-nourished. No distress.  Cardiovascular: Normal rate, regular rhythm and normal heart sounds.  Pulmonary/Chest: Effort normal and breath sounds normal.  Abdominal: There is no tenderness. There is no CVA tenderness.  Neurological: She is alert and oriented to person, place, and time.  Skin: Skin is warm and dry.     UC Treatments / Results  Labs (all labs ordered are listed, but only abnormal results are displayed) Labs Reviewed  POCT URINALYSIS DIP (DEVICE) - Abnormal; Notable for the following components:      Result Value   Glucose, UA 100 (*)    Hgb urine dipstick MODERATE (*)    Protein, ur 100 (*)    Nitrite POSITIVE (*)    Leukocytes, UA TRACE (*)    All other components within normal limits  URINE CULTURE  POCT PREGNANCY, URINE    EKG  EKG Interpretation None       Radiology No results found.  Procedures Procedures (including critical care time)  Medications Ordered in UC Medications - No data to display   Initial Impression  / Assessment and Plan / UC Course  I have reviewed the triage vital signs and the nursing notes.  Pertinent labs & imaging results that were available during my care of the patient were reviewed by me and considered in my medical decision making (see chart for details).     Nitrite and leuks to urine, symptoms consistent with UTI. Complete course of macrobid. Pyridium prn. Discussed UTI prevention. Return precautions provided. Urine sent for culture to ensure sensitivity. If symptoms worsen or do not improve in the next week to return to be seen or to follow up with PCP.  Patient verbalized understanding and agreeable to plan.    Final Clinical Impressions(s) / UC Diagnoses   Final diagnoses:  Urinary tract infection without hematuria, site unspecified    ED Discharge Orders        Ordered    nitrofurantoin, macrocrystal-monohydrate, (MACROBID) 100 MG capsule  2 times daily     11/12/17 1314    phenazopyridine (PYRIDIUM) 200 MG tablet  3 times daily     11/12/17 1314       Controlled Substance Prescriptions [place] Controlled Substance Registry consulted? Not Applicable   Georgetta Haber, NP 11/12/17 1318

## 2017-11-12 NOTE — ED Triage Notes (Signed)
PT reports dysuria and urgency for 1 week.

## 2017-11-12 NOTE — Discharge Instructions (Signed)
Drink plenty of water and empty bladder regularly. Avoid caffeine or alcohol as this can irritate the bladder.  If symptoms worsen or do not improve in the next week to return to be seen or to follow up with PCP.

## 2017-11-14 LAB — URINE CULTURE: Culture: 20000 — AB

## 2019-09-08 ENCOUNTER — Encounter (HOSPITAL_COMMUNITY): Payer: Self-pay | Admitting: Emergency Medicine

## 2019-09-08 ENCOUNTER — Emergency Department (HOSPITAL_COMMUNITY)
Admission: EM | Admit: 2019-09-08 | Discharge: 2019-09-08 | Disposition: A | Discharge status: Home / Self Care | Payer: BC Managed Care – PPO | Attending: Emergency Medicine | Admitting: Emergency Medicine

## 2019-09-08 ENCOUNTER — Other Ambulatory Visit: Payer: Self-pay

## 2019-09-08 DIAGNOSIS — Z79899 Other long term (current) drug therapy: Secondary | ICD-10-CM | POA: Diagnosis not present

## 2019-09-08 DIAGNOSIS — R1012 Left upper quadrant pain: Secondary | ICD-10-CM

## 2019-09-08 DIAGNOSIS — R079 Chest pain, unspecified: Secondary | ICD-10-CM | POA: Diagnosis not present

## 2019-09-08 DIAGNOSIS — J45909 Unspecified asthma, uncomplicated: Secondary | ICD-10-CM | POA: Insufficient documentation

## 2019-09-08 DIAGNOSIS — R109 Unspecified abdominal pain: Secondary | ICD-10-CM | POA: Diagnosis present

## 2019-09-08 LAB — COMPREHENSIVE METABOLIC PANEL
ALT: 28 U/L (ref 0–44)
AST: 24 U/L (ref 15–41)
Albumin: 4.5 g/dL (ref 3.5–5.0)
Alkaline Phosphatase: 81 U/L (ref 38–126)
Anion gap: 10 (ref 5–15)
BUN: <5 mg/dL — ABNORMAL LOW (ref 6–20)
CO2: 24 mmol/L (ref 22–32)
Calcium: 9.6 mg/dL (ref 8.9–10.3)
Chloride: 106 mmol/L (ref 98–111)
Creatinine, Ser: 0.69 mg/dL (ref 0.44–1.00)
GFR calc Af Amer: >60 mL/min (ref >60)
GFR calc non Af Amer: >60 mL/min (ref >60)
Glucose, Bld: 105 mg/dL — ABNORMAL HIGH (ref 70–99)
Potassium: 4.2 mmol/L (ref 3.5–5.1)
Sodium: 140 mmol/L (ref 135–145)
Total Bilirubin: 0.2 mg/dL — ABNORMAL LOW (ref 0.3–1.2)
Total Protein: 7.3 g/dL (ref 6.5–8.1)

## 2019-09-08 LAB — URINALYSIS, ROUTINE W REFLEX MICROSCOPIC
Bacteria, UA: NONE SEEN
Bilirubin Urine: NEGATIVE
Glucose, UA: NEGATIVE mg/dL
Ketones, ur: NEGATIVE mg/dL
Leukocytes,Ua: NEGATIVE
Nitrite: NEGATIVE
Protein, ur: NEGATIVE mg/dL
Specific Gravity, Urine: 1.005 (ref 1.005–1.030)
pH: 7 (ref 5.0–8.0)

## 2019-09-08 LAB — CBC
HCT: 45.6 % (ref 36.0–46.0)
Hemoglobin: 14.8 g/dL (ref 12.0–15.0)
MCH: 33 pg (ref 26.0–34.0)
MCHC: 32.5 g/dL (ref 30.0–36.0)
MCV: 101.8 fL — ABNORMAL HIGH (ref 80.0–100.0)
Platelets: 317 10*3/uL (ref 150–400)
RBC: 4.48 MIL/uL (ref 3.87–5.11)
RDW: 12.4 % (ref 11.5–15.5)
WBC: 7.9 10*3/uL (ref 4.0–10.5)
nRBC: 0 % (ref 0.0–0.2)

## 2019-09-08 LAB — PREGNANCY, URINE: Preg Test, Ur: NEGATIVE

## 2019-09-08 LAB — LIPASE, BLOOD: Lipase: 25 U/L (ref 11–51)

## 2019-09-08 MED ORDER — ONDANSETRON 4 MG PO TBDP: 4.0000 mg | ORAL_TABLET | Freq: Three times a day (TID) | ORAL | 0 refills | Status: DC | PRN

## 2019-09-08 MED ORDER — FAMOTIDINE 20 MG PO TABS: 20.0000 mg | ORAL_TABLET | Freq: Two times a day (BID) | ORAL | 0 refills | Status: DC | PRN

## 2019-09-08 MED ORDER — ACETAMINOPHEN 325 MG PO TABS
650.0000 mg | ORAL_TABLET | Freq: Once | ORAL | Status: AC
  Administered 2019-09-08: 18:00:00 650 mg via ORAL
  Filled 2019-09-08: qty 2

## 2019-09-08 MED ORDER — FAMOTIDINE 20 MG PO TABS
20.0000 mg | ORAL_TABLET | Freq: Once | ORAL | Status: AC
  Administered 2019-09-08: 20 mg via ORAL
  Filled 2019-09-08: qty 1

## 2019-09-08 MED ORDER — ONDANSETRON 4 MG PO TBDP
8.0000 mg | ORAL_TABLET | Freq: Once | ORAL | Status: AC
  Administered 2019-09-08: 8 mg via ORAL
  Filled 2019-09-08: qty 2

## 2019-09-08 MED ORDER — SODIUM CHLORIDE 0.9% FLUSH: 3.0000 mL | Freq: Once | INTRAVENOUS | Status: DC

## 2019-09-08 NOTE — ED Notes (Signed)
Patient verbalizes understanding of discharge instructions . Opportunity for questions and answers were provided . Armband removed by staff ,Pt discharged from ED. W/C  offered at D/C  and Declined W/C at D/C and was escorted to lobby by RN.  

## 2019-09-08 NOTE — ED Triage Notes (Signed)
C/o LLQ pain x 1 week with nausea.  Denies urinary symptoms, vomiting, and diarrhea.

## 2019-09-08 NOTE — ED Provider Notes (Signed)
[facility] Provider Note   CSN: [id number] Arrival date & time: 09/08/19  1418     History   Chief Complaint Chief Complaint  Patient presents with  . Abdominal Pain    HPI Sonya Alexander is a 23 y.o. female with history of GERD and asthma who presents with abdominal pain. She states that her pain started about one week ago. It is in the LUQ and radiates down the left side of her abdomen. Nothing makes it better or worse. The pain will intensify randomly and will cause her to curl up in a ball. It feels like cramping. She wasn't sure if it was related to her menstrual cycle or not but she had her period and it didn't get better. The pain got worse while she was driving today and it's been causing her a lot of stress and fatigue so she decided to come to the ED. She been taking Ibuprofen regularly for the pain. She doesn't take anything for her reflux. She is getting some substernal chest pain but it doesn't feel like reflux symptoms she's had in the past. She denies fever, current chest pain or SOB, cough, N/V/D, urinary symptoms, pelvic pain, vaginal discharge or bleeding. She denies prior abdominal surgeries. She doesn't drink alcohol regularly but last week was her birthday and she did drink a lot at that time.     HPI  Past Medical History:  Diagnosis Date  . Asthma   . GERD (gastroesophageal reflux disease)     There are no active problems to display for this patient.   Past Surgical History:  Procedure Laterality Date  . ANKLE FRACTURE SURGERY    . TONSILLECTOMY    . tubes in ears       OB History   No obstetric history on file.      Home Medications    Prior to Admission medications   Medication Sig Start Date End Date Taking? Authorizing Provider  HYDROcodone-acetaminophen (NORCO/VICODIN) 5-325 MG tablet Take 1 tablet by mouth every 4 (four) hours as needed. 10/05/16   Rolland PorterJames, Mark, MD  naproxen (NAPROSYN) 500 MG tablet  Take 1 tablet (500 mg total) by mouth 2 (two) times daily. 10/05/16   Rolland PorterJames, Mark, MD  phenazopyridine (PYRIDIUM) 200 MG tablet Take 1 tablet (200 mg total) by mouth 3 (three) times daily. 11/12/17   Georgetta HaberBurky, Natalie B, NP    Family History No family history on file.  Social History Social History   Tobacco Use  . Smoking status: Never Smoker  . Smokeless tobacco: Never Used  Substance Use Topics  . Alcohol use: Yes  . Drug use: No     Allergies   Patient has no known allergies.   Review of Systems Review of Systems  Constitutional: Negative for chills and fever.  Respiratory: Negative for shortness of breath.   Cardiovascular: Positive for chest pain.  Gastrointestinal: Positive for abdominal pain. Negative for constipation, diarrhea, nausea and vomiting.  Genitourinary: Negative for dysuria, pelvic pain, vaginal bleeding and vaginal discharge.  All other systems reviewed and are negative.    Physical Exam Updated Vital Signs BP 127/78 (BP Location: Right Arm)   Pulse 85   Temp 99 F (37.2 C) (Oral)   Resp 16   LMP 09/01/2019   SpO2 96%   Physical Exam Vitals signs and nursing note reviewed.  Constitutional:      General: She is not in acute distress.    Appearance: Normal appearance. She  is well-developed. She is not ill-appearing.  HENT:     Head: Normocephalic and atraumatic.  Eyes:     General: No scleral icterus.       Right eye: No discharge.        Left eye: No discharge.     Conjunctiva/sclera: Conjunctivae normal.     Pupils: Pupils are equal, round, and reactive to light.  Neck:     Musculoskeletal: Normal range of motion.  Cardiovascular:     Rate and Rhythm: Normal rate and regular rhythm.  Pulmonary:     Effort: Pulmonary effort is normal. No respiratory distress.     Breath sounds: Normal breath sounds.  Abdominal:     General: There is no distension.     Palpations: Abdomen is soft.     Tenderness: There is abdominal tenderness (minimal  LUQ tenderness).  Skin:    General: Skin is warm and dry.  Neurological:     Mental Status: She is alert and oriented to person, place, and time.  Psychiatric:        Behavior: Behavior normal.      ED Treatments / Results  Labs (all labs ordered are listed, but only abnormal results are displayed) Labs Reviewed  COMPREHENSIVE METABOLIC PANEL - Abnormal; Notable for the following components:      Result Value   Glucose, Bld 105 (*)    BUN <5 (*)    Total Bilirubin 0.2 (*)    All other components within normal limits  CBC - Abnormal; Notable for the following components:   MCV 101.8 (*)    All other components within normal limits  URINALYSIS, ROUTINE W REFLEX MICROSCOPIC - Abnormal; Notable for the following components:   Color, Urine STRAW (*)    Hgb urine dipstick SMALL (*)    All other components within normal limits  LIPASE, BLOOD  PREGNANCY, URINE  I-STAT BETA HCG BLOOD, ED ([facility], [facility], [facility] ONLY)    EKG None  Radiology No results found.  Procedures Procedures (including critical care time)  Medications Ordered in ED Medications  sodium chloride flush (NS) 0.9 % injection 3 mL (has no administration in time range)  ondansetron (ZOFRAN-ODT) disintegrating tablet 8 mg (8 mg Oral Given 09/08/19 1749)  famotidine (PEPCID) tablet 20 mg (20 mg Oral Given 09/08/19 1750)  acetaminophen (TYLENOL) tablet 650 mg (650 mg Oral Given 09/08/19 1750)     Initial Impression / Assessment and Plan / ED Course  I have reviewed the triage vital signs and the nursing notes.  Pertinent labs & imaging results that were available during my care of the patient were reviewed by me and considered in my medical decision making (see chart for details).  23 year old female presents with 1 week of LUQ abdominal pain. Her vitals are normal. Abdomen is soft and minimally tender. Labs are reassuring. She is not pregnant. UA not consistent with UTI. At this time with well appearance, reassuring  labs and exam I don't think she needs imaging. She is in agreement. Will give symptomatic meds and recheck. I think she is having some gastritis from heavy alcohol use on her bday combined with regular NSAID use.  She was given Zofran and pepcid and Tylenol and feels better. She tolerated PO. Will send her home with the same meds and advised avoid aggravating foods, alcohol, and NSAIDs. Advised return if worse.  Final Clinical Impressions(s) / ED Diagnoses   Final diagnoses:  Left upper quadrant abdominal pain  ED Discharge Orders    None       Iris Pert 09/08/19 Marko Plume, MD 09/09/19 [phone]

## 2019-09-08 NOTE — Discharge Instructions (Signed)
Take Pepcid as needed for stomach pain (acid reducer) Take Zofran as needed for nausea Avoid very acidic, spicy, or fatty foods that might make your symptoms worse Avoid NSAIDs (Ibuprofen or Aleve) Please return if you are worsening

## 2019-09-19 IMAGING — CR DG CHEST 2V
2 series · 2 of 2 positions shown · non-contrast
Comparison: Radiographs April 16, 2015.

CLINICAL DATA: Chest pain.

EXAM:
CHEST  2 VIEW

[chest pa]
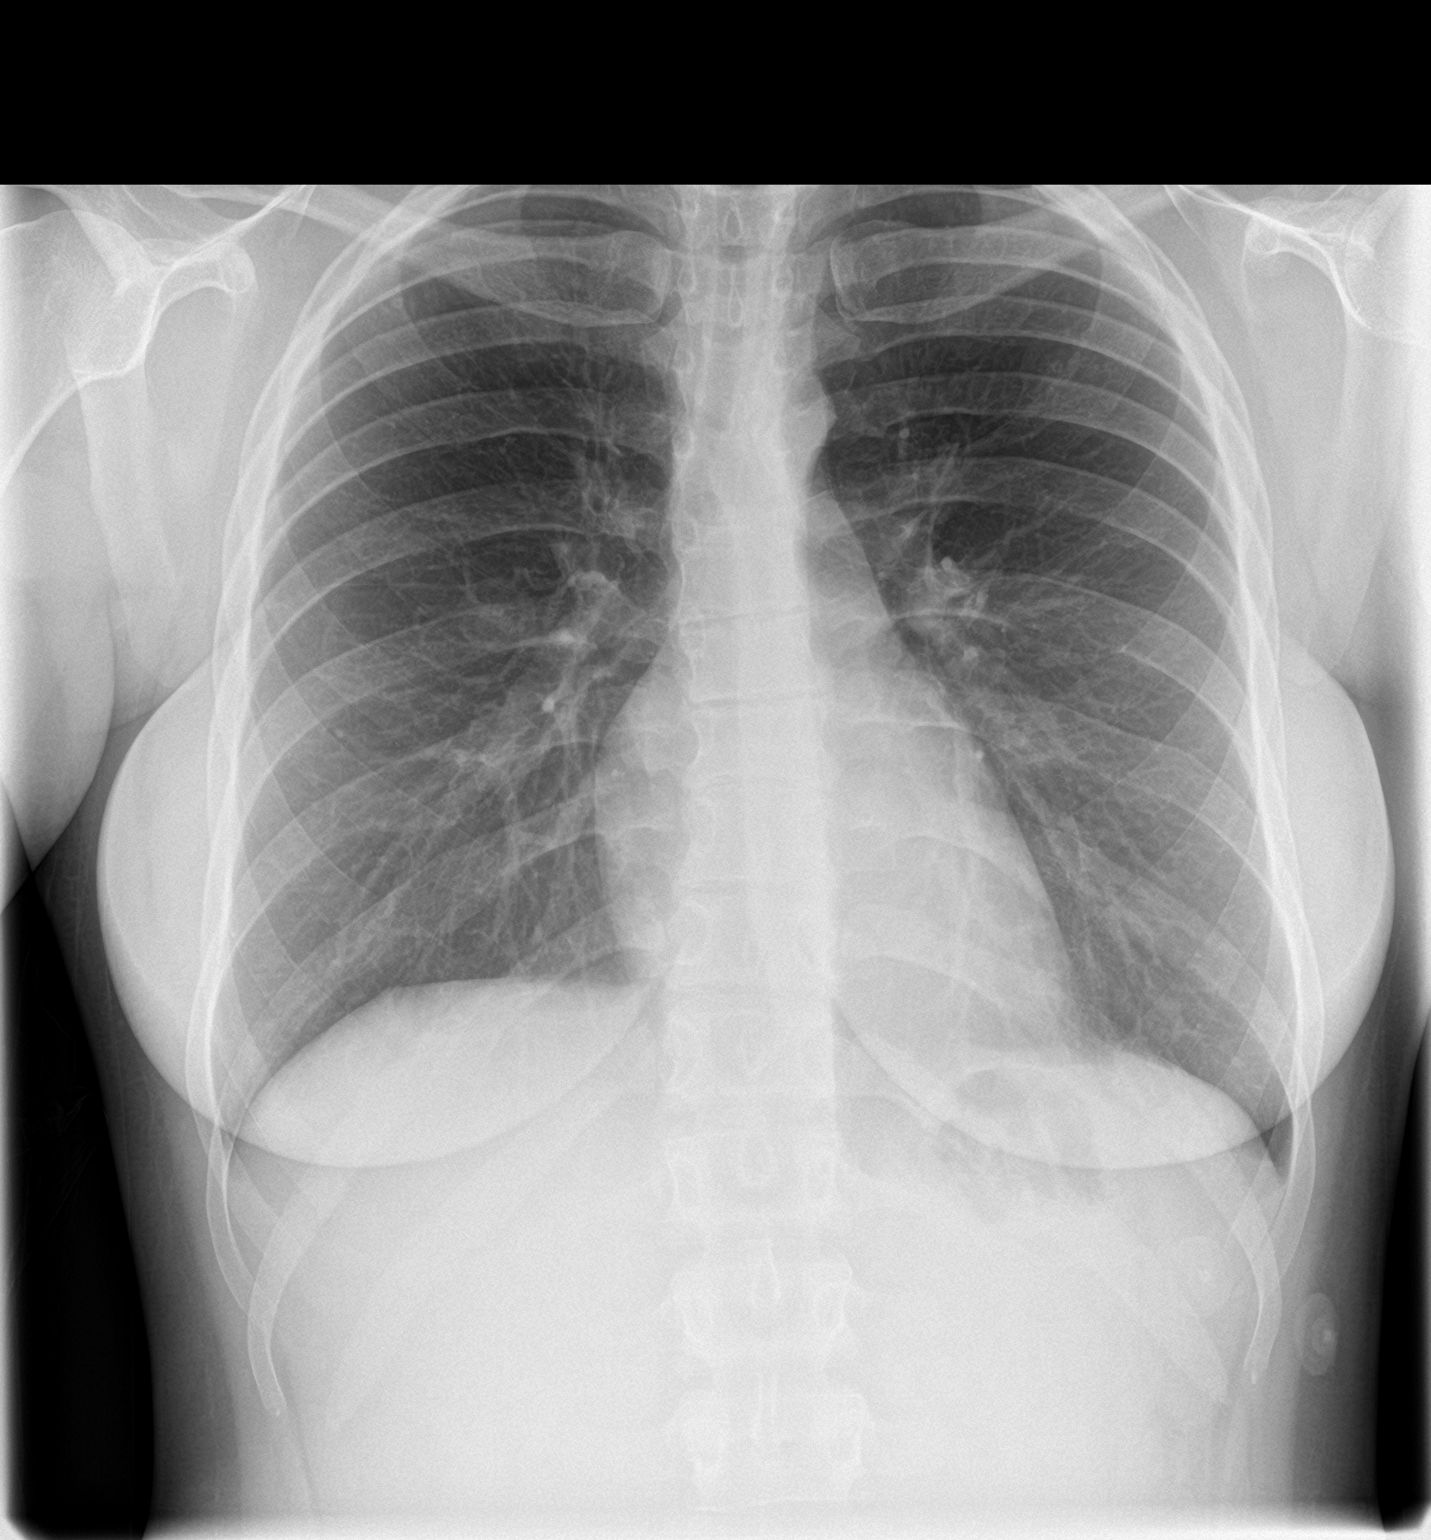

[chest lat]
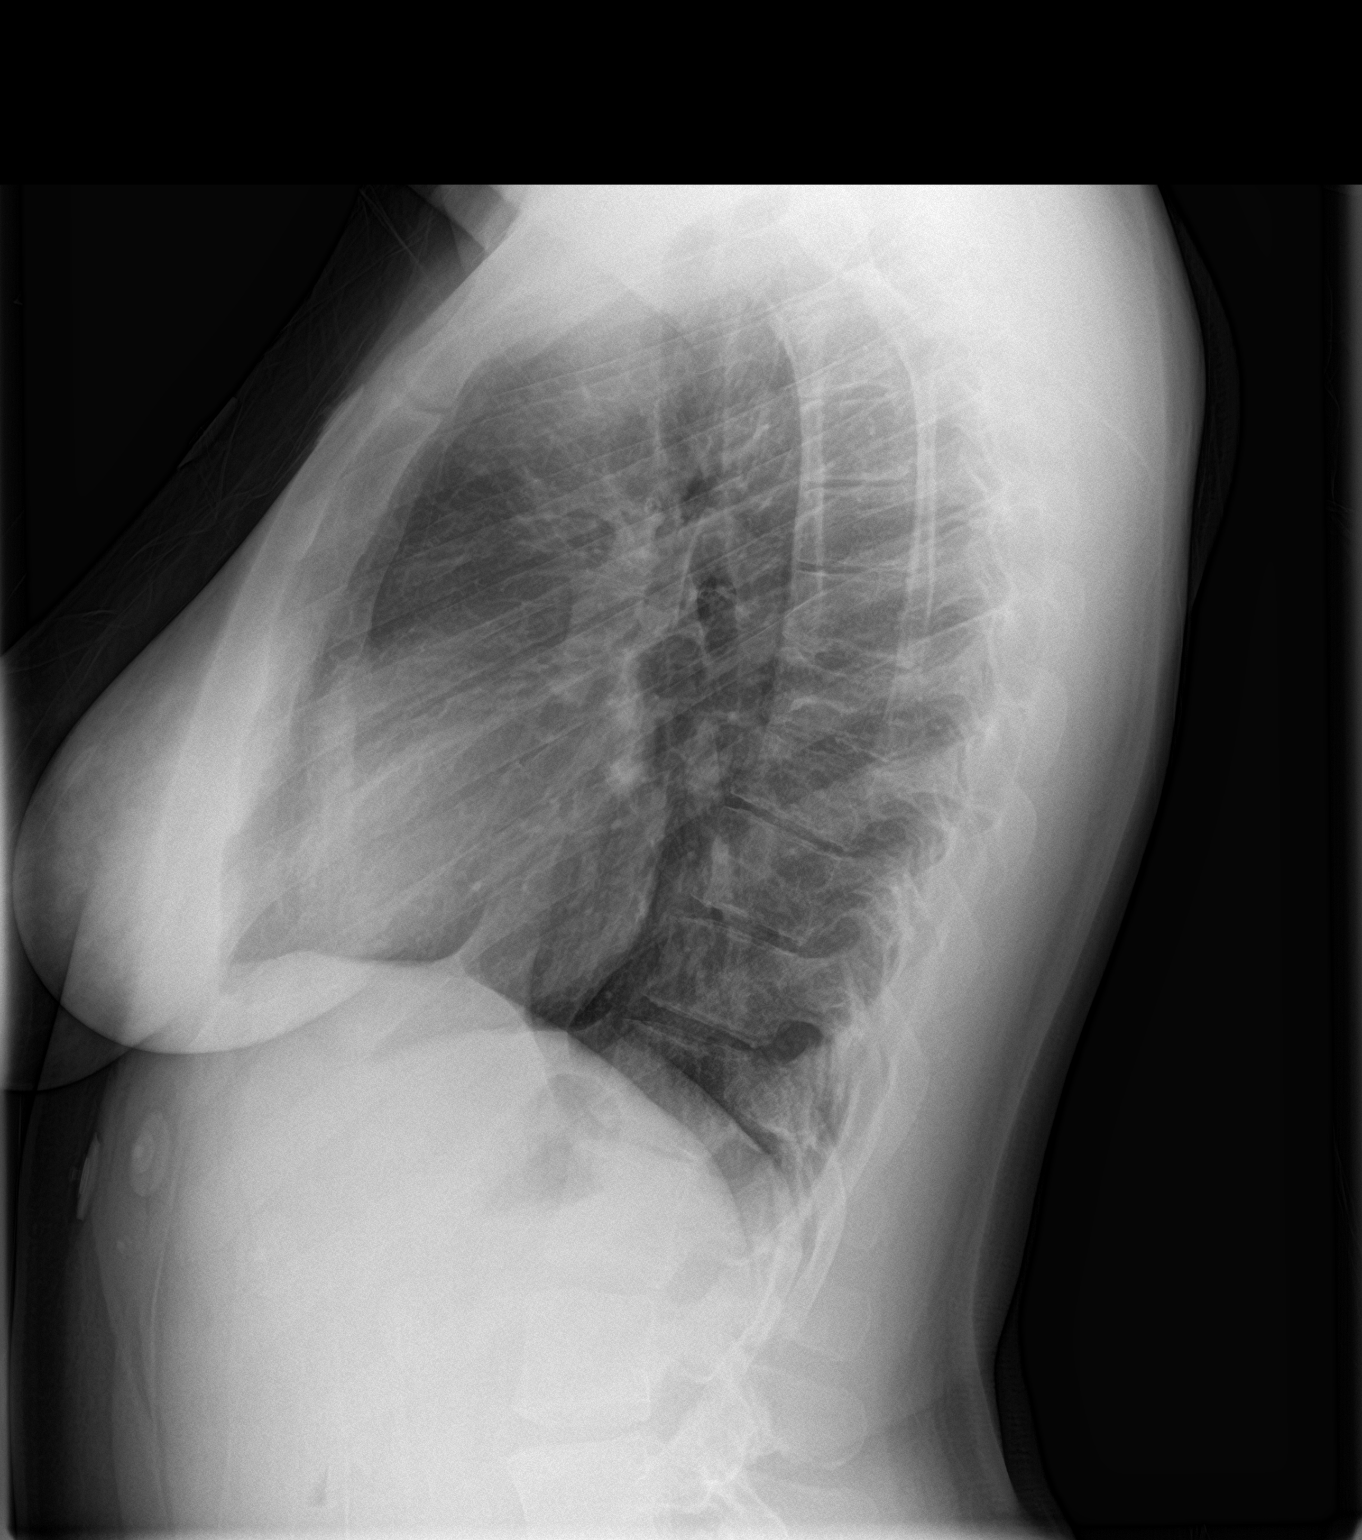

[2 of 2 positions shown; findings below may reference images not displayed]

FINDINGS: The heart size and mediastinal contours are within normal limits.
Both lungs are clear. No pneumothorax or pleural effusion is noted.
The visualized skeletal structures are unremarkable.
IMPRESSION: No active cardiopulmonary disease.

## 2020-07-10 ENCOUNTER — Ambulatory Visit (HOSPITAL_COMMUNITY)
Admission: EM | Admit: 2020-07-10 | Discharge: 2020-07-10 | Disposition: A | Discharge status: Home / Self Care | Payer: BC Managed Care – PPO

## 2020-07-10 ENCOUNTER — Other Ambulatory Visit: Payer: Self-pay

## 2020-07-10 ENCOUNTER — Encounter (HOSPITAL_COMMUNITY): Payer: Self-pay | Admitting: Urgent Care

## 2020-07-10 DIAGNOSIS — S060X0A Concussion without loss of consciousness, initial encounter: Secondary | ICD-10-CM | POA: Diagnosis not present

## 2020-07-10 DIAGNOSIS — T07XXXA Unspecified multiple injuries, initial encounter: Secondary | ICD-10-CM

## 2020-07-10 NOTE — ED Provider Notes (Signed)
[facility]    CSN: [id number] Arrival date & time: 07/10/20  1715      History   Chief Complaint Chief Complaint  Patient presents with  . Fall    HPI Sonya Alexander is a 24 y.o. female.   The history is provided by the patient. No language interpreter was used.  Fall This is a new problem. The current episode started 2 days ago. The problem occurs constantly. The problem has not changed since onset.Associated symptoms include headaches. Nothing aggravates the symptoms. Nothing relieves the symptoms. She has tried nothing for the symptoms. The treatment provided no relief.  Pt reports she fell 2 days ago and hit her head.  Pt reports she may have lost consciousness.  Pt complains of a bruise on her right side, a bruise on her right shoulder and a headache  Past Medical History:  Diagnosis Date  . Asthma   . GERD (gastroesophageal reflux disease)     There are no problems to display for this patient.   Past Surgical History:  Procedure Laterality Date  . ANKLE FRACTURE SURGERY    . TONSILLECTOMY    . tubes in ears      OB History   No obstetric history on file.      Home Medications    Prior to Admission medications   Medication Sig Start Date End Date Taking? Authorizing Provider  norelgestromin-ethinyl estradiol (ORTHO EVRA) 150-35 MCG/24HR transdermal patch Place 1 patch onto the skin once a week.   Yes [provider]  famotidine (PEPCID) 20 MG tablet Take 1 tablet (20 mg total) by mouth 2 (two) times daily between meals as needed for heartburn or indigestion. 09/08/19   Bethel Born, PA-C  HYDROcodone-acetaminophen (NORCO/VICODIN) 5-325 MG tablet Take 1 tablet by mouth every 4 (four) hours as needed. 10/05/16   Rolland Porter, MD  naproxen (NAPROSYN) 500 MG tablet Take 1 tablet (500 mg total) by mouth 2 (two) times daily. 10/05/16   Rolland Porter, MD  ondansetron (ZOFRAN ODT) 4 MG disintegrating tablet Take 1 tablet (4 mg total) by  mouth every 8 (eight) hours as needed for nausea or vomiting. 09/08/19   Bethel Born, PA-C  phenazopyridine (PYRIDIUM) 200 MG tablet Take 1 tablet (200 mg total) by mouth 3 (three) times daily. 11/12/17   Georgetta Haber, NP    Family History History reviewed. No pertinent family history.  Social History Social History   Tobacco Use  . Smoking status: Never Smoker  . Smokeless tobacco: Never Used  Substance Use Topics  . Alcohol use: Yes    Alcohol/week: 2.0 standard drinks    Types: 2 Glasses of wine per week  . Drug use: No     Allergies   Patient has no known allergies.   Review of Systems Review of Systems  Neurological: Positive for headaches.  All other systems reviewed and are negative.    Physical Exam Triage Vital Signs ED Triage Vitals  Enc Vitals Group     BP 07/10/20 1846 (!) 146/92     Pulse Rate 07/10/20 1846 69     Resp 07/10/20 1846 16     Temp 07/10/20 1846 98.6 F (37 C)     Temp Source 07/10/20 1846 Oral     SpO2 07/10/20 1846 100 %     Weight 07/10/20 1849 168 lb (76.2 kg)     Height 07/10/20 1849 5\' 7"  (1.702 m)     Head Circumference --  Peak Flow --      Pain Score 07/10/20 1847 8     Pain Loc --      Pain Edu? --      Excl. in GC? --    No data found.  Updated Vital Signs BP (!) 146/92   Pulse 69   Temp 98.6 F (37 C) (Oral)   Resp 16   Ht 5\' 7"  (1.702 m)   Wt 76.2 kg   SpO2 100%   BMI 26.31 kg/m   Visual Acuity Right Eye Distance:   Left Eye Distance:   Bilateral Distance:    Right Eye Near:   Left Eye Near:    Bilateral Near:     Physical Exam Vitals and nursing note reviewed.  Constitutional:      Appearance: She is well-developed.  HENT:     Head: Normocephalic.     Mouth/Throat:     Mouth: Mucous membranes are moist.  Eyes:     Pupils: Pupils are equal, round, and reactive to light.  Cardiovascular:     Rate and Rhythm: Normal rate.  Pulmonary:     Effort: Pulmonary effort is normal.    Abdominal:     General: There is no distension.  Musculoskeletal:        General: Normal range of motion.     Cervical back: Normal range of motion.  Skin:    General: Skin is warm.  Neurological:     General: No focal deficit present.     Mental Status: She is alert and oriented to person, place, and time.  Psychiatric:        Mood and Affect: Mood normal.      UC Treatments / Results  Labs (all labs ordered are listed, but only abnormal results are displayed) Labs Reviewed - No data to display  EKG   Radiology No results found.  Procedures Procedures (including critical care time)  Medications Ordered in UC Medications - No data to display  Initial Impression / Assessment and Plan / UC Course  I have reviewed the triage vital signs and the nursing notes.  Pertinent labs & imaging results that were available during my care of the patient were reviewed by me and considered in my medical decision making (see chart for details).     MDM:  Pt has normal neuro exam.  Pt does not want to go to Ed for ct.  I feel like significant brain injury is unlikely.  Pt advised tylenol  Pt agrees to go to Ed if symptoms worsen or cahnge.  Final Clinical Impressions(s) / UC Diagnoses   Final diagnoses:  Concussion without loss of consciousness, initial encounter  Multiple contusions     Discharge Instructions     Tylenol every 4 hours as needed.  Go to the Emergency department if symptoms worsen or cahnge   ED Prescriptions    None     PDMP not reviewed this encounter.  An After Visit Summary was printed and given to the patient.    , Elson Areas 07/10/20 1918

## 2020-07-10 NOTE — ED Triage Notes (Signed)
Pt slipped and fell on the last step at the top of the steps and rolled down the steps two days ago and hit head and had a brief LOC. Pt states she had brain fog and blurred vision. 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 Pt state she has worse head pain today and is seeing "sparkling spots" with her eyes.

## 2020-07-10 NOTE — Discharge Instructions (Addendum)
Tylenol every 4 hours as needed.  Go to the Emergency department if symptoms worsen or cahnge

## 2021-01-11 ENCOUNTER — Ambulatory Visit (HOSPITAL_COMMUNITY)
Admission: EM | Admit: 2021-01-11 | Discharge: 2021-01-11 | Disposition: A | Discharge status: Home / Self Care | Payer: BC Managed Care – PPO | Attending: Urgent Care | Admitting: Urgent Care

## 2021-01-11 ENCOUNTER — Encounter (HOSPITAL_COMMUNITY): Payer: Self-pay | Admitting: Emergency Medicine

## 2021-01-11 ENCOUNTER — Ambulatory Visit (HOSPITAL_BASED_OUTPATIENT_CLINIC_OR_DEPARTMENT_OTHER)
Admission: RE | Admit: 2021-01-11 | Discharge: 2021-01-11 | Disposition: A | Discharge status: Home / Self Care | Payer: BC Managed Care – PPO | Source: Ambulatory Visit | Attending: Urgent Care | Admitting: Urgent Care

## 2021-01-11 ENCOUNTER — Other Ambulatory Visit: Payer: Self-pay

## 2021-01-11 DIAGNOSIS — M79604 Pain in right leg: Secondary | ICD-10-CM | POA: Diagnosis not present

## 2021-01-11 DIAGNOSIS — M79661 Pain in right lower leg: Secondary | ICD-10-CM | POA: Insufficient documentation

## 2021-01-11 DIAGNOSIS — R58 Hemorrhage, not elsewhere classified: Secondary | ICD-10-CM | POA: Insufficient documentation

## 2021-01-11 DIAGNOSIS — R200 Anesthesia of skin: Secondary | ICD-10-CM | POA: Diagnosis not present

## 2021-01-11 LAB — CBC WITH DIFFERENTIAL/PLATELET
Abs Immature Granulocytes: 0.02 10*3/uL (ref 0.00–0.07)
Basophils Absolute: 0 10*3/uL (ref 0.0–0.1)
Basophils Relative: 0 %
Eosinophils Absolute: 0.1 10*3/uL (ref 0.0–0.5)
Eosinophils Relative: 1 %
HCT: 41.8 % (ref 36.0–46.0)
Hemoglobin: 13.8 g/dL (ref 12.0–15.0)
Immature Granulocytes: 0 %
Lymphocytes Relative: 29 %
Lymphs Abs: 2 10*3/uL (ref 0.7–4.0)
MCH: 32.6 pg (ref 26.0–34.0)
MCHC: 33 g/dL (ref 30.0–36.0)
MCV: 98.8 fL (ref 80.0–100.0)
Monocytes Absolute: 0.4 10*3/uL (ref 0.1–1.0)
Monocytes Relative: 5 %
Neutro Abs: 4.5 10*3/uL (ref 1.7–7.7)
Neutrophils Relative %: 65 %
Platelets: 307 10*3/uL (ref 150–400)
RBC: 4.23 MIL/uL (ref 3.87–5.11)
RDW: 12.2 % (ref 11.5–15.5)
WBC: 7 10*3/uL (ref 4.0–10.5)
nRBC: 0 % (ref 0.0–0.2)

## 2021-01-11 LAB — CK: Total CK: 114 U/L (ref 38–234)

## 2021-01-11 MED ORDER — NAPROXEN 375 MG PO TABS: 375.0000 mg | ORAL_TABLET | Freq: Two times a day (BID) | ORAL | 0 refills | Status: DC

## 2021-01-11 NOTE — Discharge Instructions (Signed)
Do not check in as a patient in the emergency room. Go to the main hospital and let them know that you are there for an outpatient ultrasound and need to go have a DVT/blood clot study of your leg. Your appointment is set for 2pm. I will call you to discuss the results and let you know what medications we will prescribe for your current symptoms.

## 2021-01-11 NOTE — ED Triage Notes (Addendum)
Pt presents with right leg pain/ numbness. States has had the feeling of cramping in same knee over the past couple weeks. States now has a bruise behind right knee.   Denies any chest pain at this time but does state had chest pressure last night, but just went to sleep.   Okay with Marlowe Shores, PA for pt to be evaluated at [facility].

## 2021-01-11 NOTE — ED Notes (Signed)
[facility]   Monday 01/11/2021 [facility] 2 pm

## 2021-01-11 NOTE — ED Provider Notes (Addendum)
[facility]   MRN: [id number] DOB: 09-25-1996  Subjective:   Sonya Alexander is a 25 y.o. female presenting for 2-week history of persistent intermittent right calf cramping, pain now having tingling sensation of the right lower leg.  She has noticed bruising over the back upper part of her calf.  Denies any falls, trauma, history of clotting disorder, history of DVT.  Has had some intermittent chest pressure and chest tightness.  She reports that she does have mild persistent asthma, uses albuterol inhaler as needed.  She does have difficulty with increased activity causing her chest pressure and chest tightness but does not feel it is necessarily different in this past week.  Has not needed an inhaler.  She is on OCP, is not a smoker.  Denies any other inciting event for her right calf, right lower leg including heavy exercise.  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  No current facility-administered medications for this encounter.  Current Outpatient Medications:  .  SPRINTEC 28 0.25-35 MG-MCG tablet, Take 1 tablet by mouth daily., Disp: , Rfl:    No Known Allergies  Past Medical History:  Diagnosis Date  . Asthma   . GERD (gastroesophageal reflux disease)      Past Surgical History:  Procedure Laterality Date  . ANKLE FRACTURE SURGERY    . TONSILLECTOMY    . tubes in ears      Family History  Adopted: Yes  Problem Relation Age of Onset  . Varicose Veins Mother   . Hypertension Father     Social History   Tobacco Use  . Smoking status: Never Smoker  . Smokeless tobacco: Never Used  Substance Use Topics  . Alcohol use: Yes    Alcohol/week: 2.0 standard drinks    Types: 2 Glasses of wine per week  . Drug use: No    ROS   Objective:   Vitals: BP 139/69 (BP Location: Right Arm)   Pulse 77   Temp 98.6 F (37 C) (Oral)   Resp 16   LMP 12/28/2020   SpO2 100%   Physical Exam Constitutional:      General: She is not in  acute distress.    Appearance: Normal appearance. She is well-developed. She is not ill-appearing, toxic-appearing or diaphoretic.  HENT:     Head: Normocephalic and atraumatic.     Nose: Nose normal.     Mouth/Throat:     Mouth: Mucous membranes are moist.  Eyes:     General: No scleral icterus.       Right eye: No discharge.        Left eye: No discharge.     Extraocular Movements: Extraocular movements intact.     Conjunctiva/sclera: Conjunctivae normal.     Pupils: Pupils are equal, round, and reactive to light.  Cardiovascular:     Rate and Rhythm: Normal rate and regular rhythm.     Pulses: Normal pulses.     Heart sounds: Normal heart sounds. No murmur heard. No friction rub. No gallop.   Pulmonary:     Effort: Pulmonary effort is normal. No respiratory distress.     Breath sounds: Normal breath sounds. No stridor. No wheezing, rhonchi or rales.  Musculoskeletal:        General: Swelling (trace over superior portion of calf with associated ecchymosis, positive Allen sign) present.     Comments: Strength 5/5 for lower extremities.  Full range of motion.  Skin:  General: Skin is warm and dry.     Findings: No rash.  Neurological:     Mental Status: She is alert and oriented to person, place, and time.     Motor: No weakness.     Coordination: Coordination normal.     Gait: Gait normal.     Deep Tendon Reflexes: Reflexes normal.  Psychiatric:        Mood and Affect: Mood normal.        Behavior: Behavior normal.        Thought Content: Thought content normal.        Judgment: Judgment normal.     Assessment and Plan :   PDMP not reviewed this encounter.  1. Right leg pain   2. Right calf pain   3. Ecchymosis   4. Numbness     We will pursue ultrasound for rule out of a DVT.  Also on the differential is superficial thrombophlebitis, musculoskeletal pain, calf strain.  We will follow-up with results and discuss treatment plan at that time. Counseled patient on  potential for adverse effects with medications prescribed/recommended today, ER and return-to-clinic precautions discussed, patient verbalized understanding.    Wallis Bamberg, PA-C 01/11/21 1351   Patient had a negative DVT study.  Discussed this with patient and will manage for superficial thrombophlebitis with rice method, naproxen twice daily for pain and inflammation.  CBC and CK level still pending.  We will follow-up with those results.   Wallis Bamberg, PA-C 01/11/21 1425

## 2021-12-01 ENCOUNTER — Encounter: Payer: Self-pay | Admitting: Nurse Practitioner

## 2021-12-01 ENCOUNTER — Ambulatory Visit: Payer: BC Managed Care – PPO | Admitting: Nurse Practitioner

## 2021-12-01 VITALS — BP 132/76 | HR 70 | Temp 98.0°F | Ht 67.0 in | Wt 179.0 lb

## 2021-12-01 DIAGNOSIS — L309 Dermatitis, unspecified: Secondary | ICD-10-CM | POA: Diagnosis not present

## 2021-12-01 DIAGNOSIS — F5101 Primary insomnia: Secondary | ICD-10-CM | POA: Diagnosis not present

## 2021-12-01 DIAGNOSIS — F908 Attention-deficit hyperactivity disorder, other type: Secondary | ICD-10-CM | POA: Diagnosis not present

## 2021-12-01 MED ORDER — TRIAMCINOLONE ACETONIDE 0.1 % EX CREA: 1.0000 "application " | TOPICAL_CREAM | Freq: Two times a day (BID) | CUTANEOUS | 1 refills | Status: DC

## 2021-12-01 NOTE — Progress Notes (Signed)
New Patient Note  RE: Sonya Alexander MRN: [id number] DOB: 09-18-1996 Date of Office Visit: 12/01/2021  Chief Complaint: New Patient (Initial Visit) (Anxiety, ADHD?/Eczema/Chronic runny nose)  History of Present Illness: BH INSOMNIA ASSESSMENT:  Reported degree of insomnia: moderate Insomnia episode began: more than 1 month ago Sleep patterns during the week:    Routine before bed: mobile device and watch TV     Bedroom environment: comfortable     Bedroom environment comment: dark   Typical bedtime during the week: 11 pm   Time to fall asleep: 20 minutes   Number of nighttime awakenings: multiple   Difficulty going back to sleep: Yes     Activity when awakened: changes position     Duration of awakening: 1 hour   Premature morning awakening: Yes     Difficult to arouse in the morning: No     Working off-shift hours: No     Sleep aids: melatonin.   Frequency of use: rarely    Eczema: Patient complains of rash. Onset of symptoms several years ago, and have been unchanged since that time.  Risk factors include history of Eczema. Treatment modalities that have been used in the past include: topical steroids.    Assessment and Plan:  Sonya Alexander is a 26 y.o. female establishing care , patient complains of eczema And symptoms of ADHD Completed referral to Psychiatry to reassess patient for ADHD as a 26 year  old.  Kenalog topical cream ordered Education provided to patient on disease progression and health maintenance. Patient will make an appointment for annual physical and labs.   No problem-specific Assessment & Plan notes found for this encounter.  Return in about 3 months (around 02/28/2022) for make appointment for physical and labs .   Diagnostics:   Past Medical History: There are no problems to display for this patient.  Past Medical History:  Diagnosis Date   Anxiety    Asthma    GERD (gastroesophageal reflux disease)    Past Surgical History: Past Surgical  History:  Procedure Laterality Date   ANKLE FRACTURE SURGERY     TONSILLECTOMY     tubes in ears     Medication List:  Current Outpatient Medications  Medication Sig Dispense Refill   triamcinolone cream (KENALOG) 0.1 % Apply 1 application topically 2 (two) times daily. 30 g 1   naproxen (NAPROSYN) 375 MG tablet Take 1 tablet (375 mg total) by mouth 2 (two) times daily with a meal. 30 tablet 0   SPRINTEC 28 0.25-35 MG-MCG tablet Take 1 tablet by mouth daily.     No current facility-administered medications for this visit.   Allergies: No Known Allergies Social History: Social History   Socioeconomic History   Marital status: Single    Spouse name: Not on file   Number of children: Not on file   Years of education: Not on file   Highest education level: Not on file  Occupational History   Not on file  Tobacco Use   Smoking status: Never   Smokeless tobacco: Never  Substance and Sexual Activity   Alcohol use: Yes    Alcohol/week: 2.0 standard drinks    Types: 2 Glasses of wine per week   Drug use: No   Sexual activity: Yes    Birth control/protection: Implant  Other Topics Concern   Not on file  Social History Narrative   Not on file   Social Determinants of Health   Financial Resource Strain: Not on file  Food Insecurity: Not on file  Transportation Needs: Not on file  Physical Activity: Not on file  Stress: Not on file  Social Connections: Not on file       Family History: Family History  Adopted: Yes  Problem Relation Age of Onset   Varicose Veins Mother    Hypertension Father          Review of Systems  Constitutional: Negative.   HENT: Negative.    Eyes: Negative.   Respiratory: Negative.    Cardiovascular: Negative.   Gastrointestinal: Negative.   Skin:  Positive for rash.  All other systems reviewed and are negative.   Objective: BP 132/76    Pulse 70    Temp 98 F (36.7 C)    Ht 5\' 7"  (1.702 m)    Wt 179 lb (81.2 kg)    LMP  11/27/2021 (Exact Date)    SpO2 98%    BMI 28.04 kg/m  Body mass index is 28.04 kg/m.   Physical Exam Vitals and nursing note reviewed.  Constitutional:      Appearance: Normal appearance.  HENT:     Head: Normocephalic.     Right Ear: Ear canal and external ear normal.     Left Ear: Ear canal and external ear normal.     Nose: Nose normal.     Mouth/Throat:     Mouth: Mucous membranes are moist.  Eyes:     Conjunctiva/sclera: Conjunctivae normal.  Cardiovascular:     Rate and Rhythm: Normal rate and regular rhythm.     Pulses: Normal pulses.     Heart sounds: Normal heart sounds.  Pulmonary:     Effort: Pulmonary effort is normal.     Breath sounds: Normal breath sounds.  Abdominal:     General: Bowel sounds are normal.  Musculoskeletal:        General: Normal range of motion.  Skin:    General: Skin is warm.     Findings: Rash present.  Neurological:     General: No focal deficit present.     Mental Status: She is alert and oriented to person, place, and time.  Psychiatric:        Mood and Affect: Mood normal.        Behavior: Behavior normal.   The plan was reviewed with the patient/family, and all questions/concerned were addressed.  It was my pleasure to see Sonya Alexander today and participate in her care. Please feel free to contact me with any questions or concerns.  Sincerely,  Helmut Muster NP [facility]

## 2021-12-01 NOTE — Patient Instructions (Addendum)
Living With Attention Deficit Hyperactivity Disorder If you have been diagnosed with attention deficit hyperactivity disorder (ADHD), you may be relieved that you now know why you have felt or behaved a certain way. Still, you may feel overwhelmed about the treatment ahead. You may also wonder how to get the support you need and how to deal with the condition day-to-day. With treatment and support, you can live with ADHD and manage your symptoms. How to manage lifestyle changes Managing stress Stress is your body's reaction to life changes and events, both good and bad. To cope with the stress of an ADHD diagnosis, it may help to: Learn more about ADHD. Exercise regularly. Even a short daily walk can lower stress levels. Participate in training or education programs (including social skills training classes) that teach you to deal with symptoms.  Medicines Your health care provider may suggest certain medicines if he or she feels that they will help to improve your condition. Stimulant medicines are usually prescribed to treat ADHD, and therapy may also be prescribed. It is important to: Avoid using alcohol and other substances that may prevent your medicines from working properly (may interact). Talk with your pharmacist or health care provider about all the medicines that you take, their possible side effects, and what medicines are safe to take together. Make it your goal to take part in all treatment decisions (shared decision-making). Ask about possible side effects of medicines that your health care provider recommends, and tell him or her how you feel about having those side effects. It is best if shared decision-making with your health care provider is part of your total treatment plan. Relationships To strengthen your relationships with family members while treating your condition, consider taking part in family therapy. You might also attend self-help groups alone or with a loved  one. Be honest about how your symptoms affect your relationships. Make an effort to communicate respectfully instead of fighting, and find ways to show others that you care. Psychotherapy may be useful in helping you cope with how ADHD affects your relationships. How to recognize changes in your condition The following signs may mean that your treatment is working well and your condition is improving: Consistently being on time for appointments. Being more organized at home and work. Other people noticing improvements in your behavior. Achieving goals that you set for yourself. Thinking more clearly. The following signs may mean that your treatment is not working very well: Feeling impatience or more confusion. Missing, forgetting, or being late for appointments. An increasing sense of disorganization and messiness. More difficulty in reaching goals that you set for yourself. Loved ones becoming angry or frustrated with you. Follow these instructions at home: Take over-the-counter and prescription medicines only as told by your health care provider. Check with your health care provider before taking any new medicines. Create structure and an organized atmosphere at home. For example: Make a list of tasks, then rank them from most important to least important. Work on one task at a time until your listed tasks are done. Make a daily schedule and follow it consistently every day. Use an appointment calendar, and check it 2 or 3 times a day to keep on track. Keep it with you when you leave the house. Create spaces where you keep certain things, and always put things back in their places after you use them. Keep all follow-up visits as told by your health care provider. This is important. Where to find support Talking to  others  Keep emotion out of important discussions and speak in a calm, logical way. Listen closely and patiently to your loved ones. Try to understand their point of view, and  try to avoid getting defensive. Take responsibility for the consequences of your actions. Ask that others do not take your behaviors personally. Aim to solve problems as they come up, and express your feelings instead of bottling them up. Talk openly about what you need from your loved ones and how they can support you. Consider going to family therapy sessions or having your family meet with a specialist who deals with ADHD-related behavior problems. Finances Not all insurance plans cover mental health care, so it is important to check with your insurance carrier. If paying for co-pays or counseling services is a problem, search for a local or county mental health care center. Public mental health care services may be offered there at a low cost or no cost when you are not able to see a private health care provider. If you are taking medicine for ADHD, you may be able to get the generic form, which may be less expensive than brand-name medicine. Some makers of prescription medicines also offer help to patients who cannot afford the medicines that they need. Questions to ask your health care provider: What are the risks and benefits of taking medicines? Would I benefit from therapy? How often should I follow up with a health care provider? Contact a health care provider if: You have side effects from your medicines, such as: Repeated muscle twitches, coughing, or speech outbursts. Sleep problems. Loss of appetite. Depression. New or worsening behavior problems. Dizziness. Unusually fast heartbeat. Stomach pains. Headaches. Get help right away if: You have a severe reaction to a medicine. Your behavior suddenly gets worse. Summary With treatment and support, you can live with ADHD and manage your symptoms. The medicines that are most often prescribed for ADHD are stimulants. Consider taking part in family therapy or self-help groups with family members or friends. When you talk with  friends and family about your ADHD, be patient and communicate openly. Take over-the-counter and prescription medicines only as told by your health care provider. Check with your health care provider before taking any new medicines. This information is not intended to replace advice given to you by your health care provider. Make sure you discuss any questions you have with your health care provider. Document Revised: 03/18/2020 Document Reviewed: 03/18/2020 Elsevier Patient Education  2022 [employer]  Insomnia Insomnia is a sleep disorder that makes it difficult to fall asleep or stay asleep. Insomnia can cause fatigue, low energy, difficulty concentrating, mood swings, and poor performance at work or school. There are three different ways to classify insomnia: Difficulty falling asleep. Difficulty staying asleep. Waking up too early in the morning. Any type of insomnia can be long-term (chronic) or short-term (acute). Both are common. Short-term insomnia usually lasts for three months or less. Chronic insomnia occurs at least three times a week for longer than three months. What are the causes? Insomnia may be caused by another condition, situation, or substance, such as: Anxiety. Certain medicines. Gastroesophageal reflux disease (GERD) or other gastrointestinal conditions. Asthma or other breathing conditions. Restless legs syndrome, sleep apnea, or other sleep disorders. Chronic pain. Menopause. Stroke. Abuse of alcohol, tobacco, or illegal drugs. Mental health conditions, such as depression. Caffeine. Neurological disorders, such as Alzheimer's disease. An overactive thyroid (hyperthyroidism). Sometimes, the cause of insomnia may not be known. What increases the  risk? Risk factors for insomnia include: Gender. Women are affected more often than men. Age. Insomnia is more common as you get older. Stress. Lack of exercise. Irregular work schedule or working night  shifts. Traveling between different time zones. Certain medical and mental health conditions. What are the signs or symptoms? If you have insomnia, the main symptom is having trouble falling asleep or having trouble staying asleep. This may lead to other symptoms, such as: Feeling fatigued or having low energy. Feeling nervous about going to sleep. Not feeling rested in the morning. Having trouble concentrating. Feeling irritable, anxious, or depressed. How is this diagnosed? This condition may be diagnosed based on: Your symptoms and medical history. Your health care provider may ask about: Your sleep habits. Any medical conditions you have. Your mental health. A physical exam. How is this treated? Treatment for insomnia depends on the cause. Treatment may focus on treating an underlying condition that is causing insomnia. Treatment may also include: Medicines to help you sleep. Counseling or therapy. Lifestyle adjustments to help you sleep better. Follow these instructions at home: Eating and drinking  Limit or avoid alcohol, caffeinated beverages, and cigarettes, especially close to bedtime. These can disrupt your sleep. Do not eat a large meal or eat spicy foods right before bedtime. This can lead to digestive discomfort that can make it hard for you to sleep. Sleep habits  Keep a sleep diary to help you and your health care provider figure out what could be causing your insomnia. Write down: When you sleep. When you wake up during the night. How well you sleep. How rested you feel the next day. Any side effects of medicines you are taking. What you eat and drink. Make your bedroom a dark, comfortable place where it is easy to fall asleep. Put up shades or blackout curtains to block light from outside. Use a white noise machine to block noise. Keep the temperature cool. Limit screen use before bedtime. This includes: Watching TV. Using your smartphone, tablet, or  computer. Stick to a routine that includes going to bed and waking up at the same times every day and night. This can help you fall asleep faster. Consider making a quiet activity, such as reading, part of your nighttime routine. Try to avoid taking naps during the day so that you sleep better at night. Get out of bed if you are still awake after 15 minutes of trying to sleep. Keep the lights down, but try reading or doing a quiet activity. When you feel sleepy, go back to bed. General instructions Take over-the-counter and prescription medicines only as told by your health care provider. Exercise regularly, as told by your health care provider. Avoid exercise starting several hours before bedtime. Use relaxation techniques to manage stress. Ask your health care provider to suggest some techniques that may work well for you. These may include: Breathing exercises. Routines to release muscle tension. Visualizing peaceful scenes. Make sure that you drive carefully. Avoid driving if you feel very sleepy. Keep all follow-up visits as told by your health care provider. This is important. Contact a health care provider if: You are tired throughout the day. You have trouble in your daily routine due to sleepiness. You continue to have sleep problems, or your sleep problems get worse. Get help right away if: You have serious thoughts about hurting yourself or someone else. If you ever feel like you may hurt yourself or others, or have thoughts about taking your own life, get  help right away. You can go to your nearest emergency department or call: Your local emergency services (911 in the [place] A suicide crisis helpline, such as the National Suicide Prevention Lifeline at (351)345-5081 or 988 in the [place] This is open 24 hours a day. Summary Insomnia is a sleep disorder that makes it difficult to fall asleep or stay asleep. Insomnia can be long-term (chronic) or short-term (acute). Treatment for  insomnia depends on the cause. Treatment may focus on treating an underlying condition that is causing insomnia. Keep a sleep diary to help you and your health care provider figure out what could be causing your insomnia. This information is not intended to replace advice given to you by your health care provider. Make sure you discuss any questions you have with your health care provider. Document Revised: 04/28/2021 Document Reviewed: 08/13/2020 Elsevier Patient Education  2022 [employer] Eczema Eczema refers to a group of skin conditions that cause skin to become rough and inflamed. Each type of eczema has different triggers, symptoms, and treatments. Eczema of any type is usually itchy. Symptoms range from mild to severe. Eczema is not spread from person to person (is not contagious). It can appear on different parts of the body at different times. One person's eczema may look different from another person's eczema. What are the causes? The exact cause of this condition is not known. However, exposure to certain environmental factors, irritants, and allergens can make the condition worse. What are the signs or symptoms? Symptoms of this condition depend on the type of eczema you have. The types include: Contact dermatitis. There are two kinds: Irritant contact dermatitis. This happens when something irritates the skin and causes a rash. Allergic contact dermatitis. This happens when your skin comes in contact with something you are allergic to (allergens). This can include poison ivy, chemicals, or medicines that were applied to your skin. Atopic dermatitis. This is a long-term (chronic) skin disease that keeps coming back (recurring). It is the most common type of eczema. Usual symptoms are a red rash and itchy, dry, scaly skin. It usually starts showing signs in infancy and can last through adulthood. Dyshidrotic eczema. This is a form of eczema on the hands and feet. It shows up as very  itchy, fluid-filled blisters. It can affect people of any age but is more common before age 32. Hand eczema. This causes very itchy areas of skin on the palms and sides of the hands and fingers. This type of eczema is common in industrial jobs where you may be exposed to different types of irritants. Lichen simplex chronicus. This type of eczema occurs when a person constantly scratches one area of the body. Repeated scratching of the area leads to thickened skin (lichenification). This condition can accompany other types of eczema. It is more common in adults but may also be seen in children. Nummular eczema. This is a common type of eczema that most often affects the lower legs and the backs of the hands. It typically causes an itchy, red, circular, crusty lesion (plaque). Scratching may become a habit and can cause bleeding. Nummular eczema occurs most often in middle-aged or older people. Seborrheic dermatitis. This is a common skin disease that mainly affects the scalp. It may also affect other oily areas of the body, such as the face, sides of the nose, eyebrows, ears, eyelids, and chest. It is marked by small scaling and redness of the skin (erythema). This can affect people of all ages. In  infants, this condition is called cradle cap. Stasis dermatitis. This is a common skin disease that can cause itching, scaling, and hyperpigmentation, usually on the legs and feet. It occurs most often in people who have a condition that prevents blood from being pumped through the veins in the legs (chronic venous insufficiency). Stasis dermatitis is a chronic condition that needs long-term management. How is this diagnosed? This condition may be diagnosed based on: A physical exam of your skin. Your medical history. Skin patch tests. These tests involve using patches that contain possible allergens and placing them on your back. Your health care provider will check in a few days to see if an allergic reaction  occurred. How is this treated? Treatment for eczema is based on the type of eczema you have. You may be given hydrocortisone steroid medicine or antihistamines. These can relieve itching quickly and help reduce inflammation. These may be prescribed or purchased over the counter, depending on the strength that is needed. Follow these instructions at home: Take or apply over-the-counter and prescription medicines only as told by your health care provider. Use creams or ointments to moisturize your skin. Do not use lotions. Learn what triggers or irritates your symptoms so you can avoid these things. Treat symptom flare-ups quickly. Do not scratch your skin. This can make your rash worse. Keep all follow-up visits. This is important. Where to find more information American Academy of Dermatology: [employer]siaad.org National Eczema Association: nationaleczema.org The Society for Pediatric Dermatology: pedsderm.net Contact a health care provider if: You have severe itching, even with treatment. You scratch your skin regularly until it bleeds. Your rash looks different than usual. Your skin is painful, swollen, or more red than usual. You have a fever. Summary Eczema refers to a group of skin conditions that cause skin to become rough and inflamed. Each type has different triggers. Eczema of any type causes itching that may range from mild to severe. Treatment varies based on the type of eczema you have. Hydrocortisone steroid medicine or antihistamines can help with itching and inflammation. Protecting your skin is the best way to prevent eczema. Use creams or ointments to moisturize your skin. Avoid triggers and irritants. Treat flare-ups quickly. This information is not intended to replace advice given to you by your health care provider. Make sure you discuss any questions you have with your health care provider. Document Revised: 07/13/2020 Document Reviewed: 07/13/2020 Elsevier Patient Education   2022 [employer]

## 2022-03-01 ENCOUNTER — Encounter: Payer: BC Managed Care – PPO | Admitting: Nurse Practitioner

## 2022-03-01 ENCOUNTER — Encounter: Payer: Self-pay | Admitting: Nurse Practitioner

## 2022-03-23 ENCOUNTER — Encounter (HOSPITAL_COMMUNITY): Payer: Self-pay | Admitting: Emergency Medicine

## 2022-03-23 ENCOUNTER — Ambulatory Visit (HOSPITAL_COMMUNITY)
Admission: EM | Admit: 2022-03-23 | Discharge: 2022-03-23 | Disposition: A | Discharge status: Home / Self Care | Payer: BC Managed Care – PPO | Attending: Emergency Medicine | Admitting: Emergency Medicine

## 2022-03-23 DIAGNOSIS — R519 Headache, unspecified: Secondary | ICD-10-CM | POA: Diagnosis not present

## 2022-03-23 DIAGNOSIS — R42 Dizziness and giddiness: Secondary | ICD-10-CM | POA: Diagnosis not present

## 2022-03-23 MED ORDER — DEXAMETHASONE SODIUM PHOSPHATE 10 MG/ML IJ SOLN
INTRAMUSCULAR | Status: AC
  Filled 2022-03-23: qty 1

## 2022-03-23 MED ORDER — METOCLOPRAMIDE HCL 5 MG/ML IJ SOLN
5.0000 mg | Freq: Once | INTRAMUSCULAR | Status: AC
  Administered 2022-03-23: 5 mg via INTRAMUSCULAR

## 2022-03-23 MED ORDER — MECLIZINE HCL 25 MG PO TABS: 25.0000 mg | ORAL_TABLET | Freq: Three times a day (TID) | ORAL | 0 refills | Status: AC | PRN

## 2022-03-23 MED ORDER — KETOROLAC TROMETHAMINE 30 MG/ML IJ SOLN
INTRAMUSCULAR | Status: AC
  Filled 2022-03-23: qty 1

## 2022-03-23 MED ORDER — KETOROLAC TROMETHAMINE 30 MG/ML IJ SOLN
30.0000 mg | Freq: Once | INTRAMUSCULAR | Status: AC
  Administered 2022-03-23: 30 mg via INTRAMUSCULAR

## 2022-03-23 MED ORDER — DEXAMETHASONE SODIUM PHOSPHATE 10 MG/ML IJ SOLN
10.0000 mg | Freq: Once | INTRAMUSCULAR | Status: AC
  Administered 2022-03-23: 10 mg via INTRAMUSCULAR

## 2022-03-23 MED ORDER — METOCLOPRAMIDE HCL 5 MG/ML IJ SOLN
INTRAMUSCULAR | Status: AC
  Filled 2022-03-23: qty 2

## 2022-03-23 NOTE — ED Triage Notes (Signed)
Pt is present today with c/o feeling off balanced and left side neck pain. Pt states that sx started Monday.

## 2022-03-23 NOTE — ED Provider Notes (Signed)
[facility]    CSN: [id number] Arrival date & time: 03/23/22  1105     History   Chief Complaint Chief Complaint  Patient presents with   Neck Pain   off balance    HPI Sonya Alexander is a 26 y.o. female.  Presents with 2 day history of headache and vertigo.  Headache is frontal 8/10.  She tried ibuprofen yesterday that did not help.  Not the worst headache of her life.  States some neck pain that feels like a pinched nerve on the right.  Has been feeling vertigo like the room is spinning especially with movement.  Nausea yesterday, none today. Denies fever, recent illness, head injury, loss of consciousness, double vision, chest pain, shortness of breath, abdominal pain, vomiting, bleeding or spotting, weakness in the extremities, numbness or tingling.  No urinary symptoms.  No difficulty with speech. Menstrual cycle began yesterday.    Past Medical History:  Diagnosis Date   Anxiety    Asthma    GERD (gastroesophageal reflux disease)     There are no problems to display for this patient.   Past Surgical History:  Procedure Laterality Date   ANKLE FRACTURE SURGERY     TONSILLECTOMY     tubes in ears      OB History   No obstetric history on file.      Home Medications    Prior to Admission medications   Medication Sig Start Date End Date Taking? Authorizing Provider  meclizine (ANTIVERT) 25 MG tablet Take 1 tablet (25 mg total) by mouth 3 (three) times daily as needed for dizziness. 03/23/22  Yes Jaamal Farooqui, Wells Guiles, PA-C  triamcinolone cream (KENALOG) 0.1 % Apply 1 application topically 2 (two) times daily. 12/01/21   Ivy Lynn, NP  famotidine (PEPCID) 20 MG tablet Take 1 tablet (20 mg total) by mouth 2 (two) times daily between meals as needed for heartburn or indigestion. 09/08/19 01/11/21  Recardo Evangelist, PA-C  norelgestromin-ethinyl estradiol (ORTHO EVRA) 150-35 MCG/24HR transdermal patch Place 1 patch onto the skin once a week.  01/11/21   [provider]    Family History Family History  Adopted: Yes  Problem Relation Age of Onset   Varicose Veins Mother    Hypertension Father     Social History Social History   Tobacco Use   Smoking status: Never   Smokeless tobacco: Never  Substance Use Topics   Alcohol use: Yes    Alcohol/week: 2.0 standard drinks    Types: 2 Glasses of wine per week   Drug use: No     Allergies   Patient has no known allergies.   Review of Systems Review of Systems  Musculoskeletal:  Positive for neck pain.  Neurological:  Positive for dizziness and headaches.   Per HPI  Physical Exam Triage Vital Signs ED Triage Vitals  Enc Vitals Group     BP 03/23/22 1151 124/64     Pulse Rate 03/23/22 1151 75     Resp 03/23/22 1151 18     Temp 03/23/22 1151 98.9 F (37.2 C)     Temp src --      SpO2 03/23/22 1151 100 %     Weight --      Height --      Head Circumference --      Peak Flow --      Pain Score 03/23/22 1154 8     Pain Loc --      Pain Edu? --  Excl. in GC? --    No data found.  Updated Vital Signs BP 124/64   Pulse 75   Temp 98.9 F (37.2 C)   Resp 18   LMP 03/22/2022   SpO2 100%    Physical Exam Vitals and nursing note reviewed.  Constitutional:      General: She is not in acute distress. HENT:     Head: Normocephalic and atraumatic.     Right Ear: Tympanic membrane and ear canal normal.     Left Ear: Tympanic membrane and ear canal normal.     Nose: Nose normal.     Mouth/Throat:     Mouth: Mucous membranes are moist.     Pharynx: Oropharynx is clear.  Eyes:     Extraocular Movements: Extraocular movements intact.     Conjunctiva/sclera: Conjunctivae normal.     Pupils: Pupils are equal, round, and reactive to light.  Cardiovascular:     Rate and Rhythm: Normal rate and regular rhythm.     Heart sounds: Normal heart sounds.  Pulmonary:     Effort: Pulmonary effort is normal. No respiratory distress.     Breath sounds: Normal  breath sounds.  Abdominal:     Palpations: Abdomen is soft.     Tenderness: There is no abdominal tenderness.  Musculoskeletal:        General: Normal range of motion.     Cervical back: Normal range of motion.  Neurological:     General: No focal deficit present.     Mental Status: She is alert and oriented to person, place, and time.     Cranial Nerves: Cranial nerves 2-12 are intact. No cranial nerve deficit or facial asymmetry.     Sensory: Sensation is intact. No sensory deficit.     Motor: Motor function is intact. No weakness.     Coordination: Coordination is intact. Coordination normal.     Gait: Gait is intact. Gait normal.     Comments: Full ROM neck and all extremities. Vertigo reproduced with head motion. Sensation intact, pulses intact, strength 5/5 all extremities    UC Treatments / Results  Labs (all labs ordered are listed, but only abnormal results are displayed) Labs Reviewed - No data to display  EKG  Radiology No results found.  Procedures Procedures  Medications Ordered in UC Medications  ketorolac (TORADOL) 30 MG/ML injection 30 mg (30 mg Intramuscular Given 03/23/22 1233)  metoCLOPramide (REGLAN) injection 5 mg (5 mg Intramuscular Given 03/23/22 1233)  dexamethasone (DECADRON) injection 10 mg (10 mg Intramuscular Given 03/23/22 1227)    Initial Impression / Assessment and Plan / UC Course  I have reviewed the triage vital signs and the nursing notes.  Pertinent labs & imaging results that were available during my care of the patient were reviewed by me and considered in my medical decision making (see chart for details).  Headache cocktail given - Toradol, Reglan, Decadron. Patient states head and neck pain both improved. Vertigo somewhat improved. Her neurological exam is unremarkable.  Physical exam reassuring, vitals are stable. Will prescribe meclizine to use for vertigo episodes.  I recommend patient follow-up with her primary care provider if  symptoms persist.  Also recommend to increase water intake. She understands to go to the emergency department if her symptoms worsen. Return precautions discussed. Patient agrees to plan and is discharged in stable condition.  Final Clinical Impressions(s) / UC Diagnoses   Final diagnoses:  Acute nonintractable headache, unspecified headache type  Vertigo  Discharge Instructions      Please use vertigo medicine 3 times daily as needed for dizziness.  I recommend follow-up with your primary care provider if symptoms persist.  If symptoms worsen or do not improve please go to the emergency department.     ED Prescriptions     Medication Sig Dispense Auth. Provider   meclizine (ANTIVERT) 25 MG tablet Take 1 tablet (25 mg total) by mouth 3 (three) times daily as needed for dizziness. 30 tablet Coleman Kalas, Wells Guiles, PA-C      PDMP not reviewed this encounter.   Darey Hershberger, Wells Guiles, [place] 03/23/22 1310

## 2022-03-23 NOTE — Discharge Instructions (Addendum)
Please use vertigo medicine 3 times daily as needed for dizziness.  I recommend follow-up with your primary care provider if symptoms persist.  If symptoms worsen or do not improve please go to the emergency department.

## 2022-06-01 ENCOUNTER — Ambulatory Visit (HOSPITAL_COMMUNITY)
Admission: EM | Admit: 2022-06-01 | Discharge: 2022-06-01 | Disposition: A | Discharge status: Home / Self Care | Payer: BC Managed Care – PPO | Attending: Physician Assistant | Admitting: Physician Assistant

## 2022-06-01 ENCOUNTER — Encounter (HOSPITAL_COMMUNITY): Payer: Self-pay

## 2022-06-01 DIAGNOSIS — N3001 Acute cystitis with hematuria: Secondary | ICD-10-CM | POA: Diagnosis present

## 2022-06-01 DIAGNOSIS — R350 Frequency of micturition: Secondary | ICD-10-CM | POA: Insufficient documentation

## 2022-06-01 DIAGNOSIS — R300 Dysuria: Secondary | ICD-10-CM | POA: Insufficient documentation

## 2022-06-01 LAB — POCT URINALYSIS DIPSTICK, ED / UC
Bilirubin Urine: NEGATIVE
Glucose, UA: NEGATIVE mg/dL
Ketones, ur: 15 mg/dL — AB
Leukocytes,Ua: NEGATIVE
Nitrite: POSITIVE — AB
Protein, ur: NEGATIVE mg/dL
Specific Gravity, Urine: 1.025 (ref 1.005–1.030)
Urobilinogen, UA: 1 mg/dL (ref 0.0–1.0)
pH: 5.5 (ref 5.0–8.0)

## 2022-06-01 LAB — POC URINE PREG, ED: Preg Test, Ur: NEGATIVE

## 2022-06-01 MED ORDER — NITROFURANTOIN MONOHYD MACRO 100 MG PO CAPS: 100.0000 mg | ORAL_CAPSULE | Freq: Two times a day (BID) | ORAL | 0 refills | Status: DC

## 2022-06-01 NOTE — ED Provider Notes (Signed)
[facility]    CSN: [id number] Arrival date & time: 06/01/22  1501      History   Chief Complaint No chief complaint on file.   HPI Sonya Alexander is a 26 y.o. female.   Patient presents today with a 2-day history of UTI symptoms.  Reports fatigue, hot flashes, nausea, dysuria, urinary frequency.  Denies any abdominal pain, pelvic pain, fever, nausea, vomiting.  She does have a history of recurrent urinary tract infections with similar presentation.  Last episode was approximately 2 years ago.  Reports taking an antibiotic several months ago for dental infection but denies additional antibiotic use in the last 90 days.  She has been taking Azo with temporary improvement of symptoms.  She denies any history of single kidney, self-catheterization, seeing urology in the past, recent urogenital procedure.  Denies history of diabetes and does not take SGLT2 inhibitor.    Past Medical History:  Diagnosis Date   Anxiety    Asthma    GERD (gastroesophageal reflux disease)     There are no problems to display for this patient.   Past Surgical History:  Procedure Laterality Date   ANKLE FRACTURE SURGERY     TONSILLECTOMY     tubes in ears      OB History   No obstetric history on file.      Home Medications    Prior to Admission medications   Medication Sig Start Date End Date Taking? Authorizing Provider  nitrofurantoin, macrocrystal-monohydrate, (MACROBID) 100 MG capsule Take 1 capsule (100 mg total) by mouth 2 (two) times daily. 06/01/22  Yes Kaylor Simenson, Noberto Retort, PA-C  meclizine (ANTIVERT) 25 MG tablet Take 1 tablet (25 mg total) by mouth 3 (three) times daily as needed for dizziness. 03/23/22   Rising, Lurena Joiner, PA-C  triamcinolone cream (KENALOG) 0.1 % Apply 1 application topically 2 (two) times daily. 12/01/21   Daryll Drown, NP  famotidine (PEPCID) 20 MG tablet Take 1 tablet (20 mg total) by mouth 2 (two) times daily between meals as needed for heartburn or  indigestion. 09/08/19 01/11/21  Bethel Born, PA-C  norelgestromin-ethinyl estradiol (ORTHO EVRA) 150-35 MCG/24HR transdermal patch Place 1 patch onto the skin once a week.  01/11/21  [provider]    Family History Family History  Adopted: Yes  Problem Relation Age of Onset   Varicose Veins Mother    Hypertension Father     Social History Social History   Tobacco Use   Smoking status: Never   Smokeless tobacco: Never  Substance Use Topics   Alcohol use: Yes    Alcohol/week: 2.0 standard drinks of alcohol    Types: 2 Glasses of wine per week   Drug use: No     Allergies   Patient has no known allergies.   Review of Systems Review of Systems  Constitutional:  Positive for activity change. Negative for appetite change, fatigue and fever.  Gastrointestinal:  Negative for abdominal pain, diarrhea, nausea and vomiting.  Genitourinary:  Positive for dysuria and urgency. Negative for frequency, vaginal bleeding, vaginal discharge and vaginal pain.  Musculoskeletal:  Negative for arthralgias and myalgias.     Physical Exam Triage Vital Signs ED Triage Vitals  Enc Vitals Group     BP 06/01/22 1639 (!) 153/79     Pulse Rate 06/01/22 1639 98     Resp 06/01/22 1639 18     Temp 06/01/22 1639 99 F (37.2 C)     Temp Source 06/01/22 1639  Oral     SpO2 06/01/22 1639 98 %     Weight --      Height --      Head Circumference --      Peak Flow --      Pain Score 06/01/22 1640 3     Pain Loc --      Pain Edu? --      Excl. in GC? --    No data found.  Updated Vital Signs BP (!) 153/79 (BP Location: Left Arm)   Pulse 98   Temp 99 F (37.2 C) (Oral)   Resp 18   LMP 05/18/2022   SpO2 98%   Visual Acuity Right Eye Distance:   Left Eye Distance:   Bilateral Distance:    Right Eye Near:   Left Eye Near:    Bilateral Near:     Physical Exam Vitals reviewed.  Constitutional:      General: She is awake. She is not in acute distress.    Appearance:  Normal appearance. She is well-developed. She is not ill-appearing.     Comments: Very pleasant female appears stated age in no acute distress sitting comfortably in exam room  HENT:     Head: Normocephalic and atraumatic.  Cardiovascular:     Rate and Rhythm: Normal rate and regular rhythm.     Heart sounds: Normal heart sounds, S1 normal and S2 normal. No murmur heard. Pulmonary:     Effort: Pulmonary effort is normal.     Breath sounds: Normal breath sounds. No wheezing, rhonchi or rales.     Comments: Clear to auscultation bilaterally Abdominal:     General: Bowel sounds are normal.     Palpations: Abdomen is soft.     Tenderness: There is no abdominal tenderness. There is no right CVA tenderness, left CVA tenderness, guarding or rebound.     Comments: Benign abdominal exam  Psychiatric:        Behavior: Behavior is cooperative.      UC Treatments / Results  Labs (all labs ordered are listed, but only abnormal results are displayed) Labs Reviewed  POCT URINALYSIS DIPSTICK, ED / UC - Abnormal; Notable for the following components:      Result Value   Ketones, ur 15 (*)    Hgb urine dipstick MODERATE (*)    Nitrite POSITIVE (*)    All other components within normal limits  URINE CULTURE  POC URINE PREG, ED    EKG   Radiology No results found.  Procedures Procedures (including critical care time)  Medications Ordered in UC Medications - No data to display  Initial Impression / Assessment and Plan / UC Course  I have reviewed the triage vital signs and the nursing notes.  Pertinent labs & imaging results that were available during my care of the patient were reviewed by me and considered in my medical decision making (see chart for details).     UA showed hemoglobin and positive nitrites though this could be falsely positive related to Azo use.  Given clinical presentation will empirically treat with Macrobid twice daily for 5 days.  Urine pregnancy was  negative.  We will send off urine culture and we discussed that we may stop or change antibiotics based on susceptibilities identified on culture.  She is to rest and drink plenty of fluid.  Discussed that if she has any worsening symptoms including fever, persisting UTI symptoms, abdominal pain, nausea/vomiting she needs to be seen immediately.  Strict  return precautions given to which she expressed understanding.  Work existent provided.  Final Clinical Impressions(s) / UC Diagnoses   Final diagnoses:  Acute cystitis with hematuria  Dysuria  Urinary frequency     Discharge Instructions      Your urine did have evidence of infection.  We will send this off for culture.  In the meantime we will start an antibiotic.  Take nitrofurantoin twice daily for 5 days.  This will potentially make the color of your urine abnormal.  If we need to change or stop your antibiotic we will contact you.  Make sure you are drinking plenty of fluid.  If you develop any abdominal pain, pelvic pain, fever, nausea, vomiting you need to be seen immediately.     ED Prescriptions     Medication Sig Dispense Auth. Provider   nitrofurantoin, macrocrystal-monohydrate, (MACROBID) 100 MG capsule Take 1 capsule (100 mg total) by mouth 2 (two) times daily. 10 capsule Akio Hudnall, Noberto Retort, PA-C      PDMP not reviewed this encounter.   Jeani Hawking, PA-C 06/01/22 1658

## 2022-06-01 NOTE — Discharge Instructions (Signed)
Your urine did have evidence of infection.  We will send this off for culture.  In the meantime we will start an antibiotic.  Take nitrofurantoin twice daily for 5 days.  This will potentially make the color of your urine abnormal.  If we need to change or stop your antibiotic we will contact you.  Make sure you are drinking plenty of fluid.  If you develop any abdominal pain, pelvic pain, fever, nausea, vomiting you need to be seen immediately.

## 2022-06-01 NOTE — ED Triage Notes (Signed)
Pt c/o rt flank pain, burning on urination, fatigue, and nausea x2 days. States took AZO this am.   States feeling fatigue and nausea since Monday.

## 2022-06-02 LAB — URINE CULTURE

## 2022-09-02 ENCOUNTER — Emergency Department (HOSPITAL_COMMUNITY)
Admission: EM | Admit: 2022-09-02 | Discharge: 2022-09-02 | Discharge status: Against Medical Advice | Payer: BC Managed Care – PPO | Attending: Emergency Medicine | Admitting: Emergency Medicine

## 2022-09-02 ENCOUNTER — Encounter (HOSPITAL_COMMUNITY): Payer: Self-pay

## 2022-09-02 ENCOUNTER — Other Ambulatory Visit: Payer: Self-pay

## 2022-09-02 DIAGNOSIS — Z5321 Procedure and treatment not carried out due to patient leaving prior to being seen by health care provider: Secondary | ICD-10-CM | POA: Diagnosis not present

## 2022-09-02 DIAGNOSIS — R300 Dysuria: Secondary | ICD-10-CM | POA: Diagnosis present

## 2022-09-02 DIAGNOSIS — R350 Frequency of micturition: Secondary | ICD-10-CM | POA: Insufficient documentation

## 2022-09-02 LAB — URINALYSIS, ROUTINE W REFLEX MICROSCOPIC
Bilirubin Urine: NEGATIVE
Glucose, UA: NEGATIVE mg/dL
Ketones, ur: NEGATIVE mg/dL
Leukocytes,Ua: NEGATIVE
Nitrite: NEGATIVE
Protein, ur: NEGATIVE mg/dL
Specific Gravity, Urine: 1.001 — ABNORMAL LOW (ref 1.005–1.030)
pH: 6 (ref 5.0–8.0)

## 2022-09-02 NOTE — ED Notes (Signed)
Pt called for room x3 with no response. Pt removed form floor.

## 2022-09-02 NOTE — ED Triage Notes (Signed)
Pt reports she thinks she has a UTI due to dysuria and urinary frequency onset Tuesday. She denies hematuria but reports her urine does appear cloudy. She has been taking AZO. Denies fevers/chills.

## 2022-09-02 NOTE — ED Provider Triage Note (Signed)
Emergency Medicine Provider Triage Evaluation Note  Sonya Alexander , a 26 y.o. female  was evaluated in triage.  Pt complains of dysuria over the last 2-3 days.  Works as Runner, broadcasting/film/video.  Has been having to hold her urine longer than normal due to work and believes she may have developed a UTI.  Does not have frequent UTIs.  Denies new sexual encounters/partners.  Denies vaginal symptoms or changes in bowel habits.  Reports "cloudy" urine, though denies hematuria or N/V.   Review of Systems  Positive:  Negative: See above  Physical Exam  BP 139/81 (BP Location: Right Arm)   Pulse 84   Temp 98.9 F (37.2 C)   Resp 16   Ht 5\' 7"  (1.702 m)   Wt 82.1 kg   LMP 08/12/2022 (Approximate)   SpO2 97%   BMI 28.35 kg/m  Gen:   Awake, no distress   Resp:  Normal effort  MSK:   Moves extremities without difficulty  Other:  Abdomen soft, nontender  Medical Decision Making  Medically screening exam initiated at 6:40 PM.  Appropriate orders placed.  Teagan Ozawa was informed that the remainder of the evaluation will be completed by another provider, this initial triage assessment does not replace that evaluation, and the importance of remaining in the ED until their evaluation is complete.     [place], [place], PA-C 09/02/22 1846

## 2022-09-05 ENCOUNTER — Telehealth: Payer: Self-pay

## 2022-09-05 NOTE — Telephone Encounter (Signed)
Transition Care Management Unsuccessful Follow-up Telephone Call  Date of discharge and from where:  09/02/2022 [facility]  Attempts:  1st Attempt  Reason for unsuccessful TCM follow-up call:  Left voice message

## 2022-09-07 NOTE — Telephone Encounter (Signed)
Transition Care Management Unsuccessful Follow-up Telephone Call  Date of discharge and from where:  [facility]  Attempts:  2nd Attempt  Reason for unsuccessful TCM follow-up call:  Left voice message

## 2022-09-13 NOTE — Telephone Encounter (Signed)
Transition Care Management Unsuccessful Follow-up Telephone Call  Date of discharge and from where:  09/02/22 [facility]  Attempts:  3rd Attempt  Reason for unsuccessful TCM follow-up call:  Unable to reach patient - letter sent through MyChart and mailed

## 2022-10-27 ENCOUNTER — Encounter (HOSPITAL_COMMUNITY): Payer: Self-pay

## 2022-10-27 ENCOUNTER — Inpatient Hospital Stay (HOSPITAL_COMMUNITY)
Admission: AD | Admit: 2022-10-27 | Discharge: 2022-10-27 | Disposition: A | Discharge status: Home / Self Care | Payer: BC Managed Care – PPO | Attending: Obstetrics and Gynecology | Admitting: Obstetrics and Gynecology

## 2022-10-27 ENCOUNTER — Inpatient Hospital Stay (HOSPITAL_COMMUNITY): Payer: BC Managed Care – PPO

## 2022-10-27 ENCOUNTER — Other Ambulatory Visit: Payer: Self-pay

## 2022-10-27 DIAGNOSIS — O26891 Other specified pregnancy related conditions, first trimester: Secondary | ICD-10-CM | POA: Insufficient documentation

## 2022-10-27 DIAGNOSIS — Z3A11 11 weeks gestation of pregnancy: Secondary | ICD-10-CM | POA: Diagnosis not present

## 2022-10-27 DIAGNOSIS — O209 Hemorrhage in early pregnancy, unspecified: Secondary | ICD-10-CM | POA: Diagnosis not present

## 2022-10-27 DIAGNOSIS — O3680X0 Pregnancy with inconclusive fetal viability, not applicable or unspecified: Secondary | ICD-10-CM | POA: Insufficient documentation

## 2022-10-27 LAB — ABO/RH: ABO/RH(D): B POS

## 2022-10-27 LAB — CBC
HCT: 36.2 % (ref 36.0–46.0)
Hemoglobin: 12.1 g/dL (ref 12.0–15.0)
MCH: 33 pg (ref 26.0–34.0)
MCHC: 33.4 g/dL (ref 30.0–36.0)
MCV: 98.6 fL (ref 80.0–100.0)
Platelets: 284 10*3/uL (ref 150–400)
RBC: 3.67 MIL/uL — ABNORMAL LOW (ref 3.87–5.11)
RDW: 11.9 % (ref 11.5–15.5)
WBC: 13.2 10*3/uL — ABNORMAL HIGH (ref 4.0–10.5)
nRBC: 0 % (ref 0.0–0.2)

## 2022-10-27 LAB — POCT PREGNANCY, URINE: Preg Test, Ur: POSITIVE — AB

## 2022-10-27 LAB — HCG, QUANTITATIVE, PREGNANCY: hCG, Beta Chain, Quant, S: 10379 m[IU]/mL — ABNORMAL HIGH (ref <5)

## 2022-10-27 MED ORDER — OXYCODONE HCL 5 MG PO CAPS: 5.0000 mg | ORAL_CAPSULE | Freq: Four times a day (QID) | ORAL | 0 refills | Status: DC | PRN

## 2022-10-27 MED ORDER — HYDROMORPHONE HCL 1 MG/ML IJ SOLN
1.0000 mg | Freq: Once | INTRAMUSCULAR | Status: AC
  Administered 2022-10-27: 1 mg via INTRAMUSCULAR
  Filled 2022-10-27: qty 1

## 2022-10-27 MED ORDER — PROMETHAZINE HCL 25 MG/ML IJ SOLN
12.5000 mg | Freq: Once | INTRAMUSCULAR | Status: AC
  Administered 2022-10-27: 12.5 mg via INTRAMUSCULAR
  Filled 2022-10-27: qty 1

## 2022-10-27 MED ORDER — OXYCODONE HCL 5 MG PO TABS: 5.0000 mg | ORAL_TABLET | ORAL | 0 refills | Status: AC | PRN

## 2022-10-27 MED ORDER — PROMETHAZINE HCL 25 MG PO TABS: 25.0000 mg | ORAL_TABLET | Freq: Four times a day (QID) | ORAL | 0 refills | Status: AC | PRN

## 2022-10-27 NOTE — Discharge Instructions (Signed)
Return to care  If you have heavier bleeding that soaks through more than 2 pads per hour for an hour or more If you bleed so much that you feel like you might pass out or you do pass out If you have significant abdominal pain that is not improved with Tylenol   

## 2022-10-27 NOTE — MAU Provider Note (Signed)
History     [id number]  Arrival date and time: 10/27/22 1839    Chief Complaint  Patient presents with   Abdominal Pain   Vaginal Bleeding     HPI Sonya Alexander is a 27 y.o. at [redacted]w[redacted]d who presents for vaginal bleeding & abdominal pain. Had ultrasound a few weeks ago - she should've been 9 weeks but was measuring [redacted]w[redacted]d with no FHR. Had some spotting after that. Started having pain & heavy bleeding a few hours ago. Has passed several large clots. Saturated 1 pad in the last 2 hours.    OB History     Gravida  1   Para      Term      Preterm      AB      Living         SAB      IAB      Ectopic      Multiple      Live Births              Past Medical History:  Diagnosis Date   Anxiety    Asthma    GERD (gastroesophageal reflux disease)     Past Surgical History:  Procedure Laterality Date   ANKLE FRACTURE SURGERY     TONSILLECTOMY     tubes in ears      Family History  Adopted: Yes  Problem Relation Age of Onset   Varicose Veins Mother    Hypertension Father     No Known Allergies  No current facility-administered medications on file prior to encounter.   Current Outpatient Medications on File Prior to Encounter  Medication Sig Dispense Refill   meclizine (ANTIVERT) 25 MG tablet Take 1 tablet (25 mg total) by mouth 3 (three) times daily as needed for dizziness. 30 tablet 0   nitrofurantoin, macrocrystal-monohydrate, (MACROBID) 100 MG capsule Take 1 capsule (100 mg total) by mouth 2 (two) times daily. 10 capsule 0   triamcinolone cream (KENALOG) 0.1 % Apply 1 application topically 2 (two) times daily. 30 g 1   [DISCONTINUED] famotidine (PEPCID) 20 MG tablet Take 1 tablet (20 mg total) by mouth 2 (two) times daily between meals as needed for heartburn or indigestion. 30 tablet 0   [DISCONTINUED] norelgestromin-ethinyl estradiol (ORTHO EVRA) 150-35 MCG/24HR transdermal patch Place 1 patch onto the skin once a week.       ROS Pertinent  positives and negative per HPI, all others reviewed and negative  Physical Exam   BP (!) 130/58   Pulse 78   Temp 98.4 F (36.9 C) (Oral)   Resp 16   Ht 5\' 7"  (1.702 m)   Wt 86.3 kg   LMP 08/08/2022 (Approximate)   SpO2 99% Comment: room air  BMI 29.81 kg/m   Patient Vitals for the past 24 hrs:  BP Temp Temp src Pulse Resp SpO2 Height Weight  10/27/22 1939 (!) 130/58 -- -- 78 -- -- -- --  10/27/22 1907 (!) 119/96 98.4 F (36.9 C) Oral 86 16 99 % -- --  10/27/22 1857 -- -- -- -- -- -- 5\' 7"  (1.702 m) 86.3 kg    Physical Exam Vitals and nursing note reviewed. Exam conducted with a chaperone present.  Constitutional:      General: She is not in acute distress.    Appearance: She is well-developed.  HENT:     Head: Normocephalic and atraumatic.  Pulmonary:     Effort: Pulmonary effort is normal. No  respiratory distress.  Abdominal:     General: Abdomen is flat.     Palpations: Abdomen is soft.  Genitourinary:    General: Normal vulva.     Exam position: Lithotomy position.     Comments: Moderate amount of dark red blood. Cervix visually closed.  Skin:    General: Skin is warm and dry.  Neurological:     Mental Status: She is alert.       Labs Results for orders placed or performed during the hospital encounter of 10/27/22 (from the past 24 hour(s))  Pregnancy, urine POC     Status: Abnormal   Collection Time: 10/27/22  7:22 PM  Result Value Ref Range   Preg Test, Ur POSITIVE (A) NEGATIVE  CBC     Status: Abnormal   Collection Time: 10/27/22  8:01 PM  Result Value Ref Range   WBC 13.2 (H) 4.0 - 10.5 K/uL   RBC 3.67 (L) 3.87 - 5.11 MIL/uL   Hemoglobin 12.1 12.0 - 15.0 g/dL   HCT 36.2 36.0 - 46.0 %   MCV 98.6 80.0 - 100.0 fL   MCH 33.0 26.0 - 34.0 pg   MCHC 33.4 30.0 - 36.0 g/dL   RDW 11.9 11.5 - 15.5 %   Platelets 284 150 - 400 K/uL   nRBC 0.0 0.0 - 0.2 %  hCG, quantitative, pregnancy     Status: Abnormal   Collection Time: 10/27/22  8:01 PM  Result  Value Ref Range   hCG, Beta Chain, Quant, S 10,379 (H) <5 mIU/mL  ABO/Rh     Status: None   Collection Time: 10/27/22  8:06 PM  Result Value Ref Range   ABO/RH(D) B POS    No rh immune globuloin      NOT A RH IMMUNE GLOBULIN CANDIDATE, PT RH POSITIVE Performed at [facility], [place] [address] [place], [place] [address]     Imaging US OB LESS THAN 14 WEEKS WITH OB TRANSVAGINAL  Result Date: 10/27/2022 CLINICAL DATA:  Vaginal bleeding, first trimester. EXAM: OBSTETRIC <14 WK [place] AND TRANSVAGINAL OB US TECHNIQUE: Both transabdominal and transvaginal ultrasound examinations were performed for complete evaluation of the gestation as well as the maternal uterus, adnexal regions, and pelvic cul-de-sac. Transvaginal technique was performed to assess early pregnancy. COMPARISON:  None Available. FINDINGS: Intrauterine gestational sac: Single. Located in the lower uterine segment. Elongated in shape. Yolk sac:  Visualized.  Borderline enlarged measuring 6 mm. Embryo:  Not Visualized. MSD: 23.2 mm   7 w   2 d Subchorionic hemorrhage:  None visualized. Maternal uterus/adnexae: The ovaries are within normal limits. There is no free fluid. IMPRESSION: 1. Low-lying single intrauterine gestational sac containing yolk sac only. Mean gestational sac diameter corresponds to gestational age of [redacted] weeks and 2 days. Findings are worrisome for failed IUP/abortion in progress. Please correlate clinically. Electronically Signed   By: Ronney Asters M.D.   On: 10/27/2022 21:42    MAU Course  Procedures Lab Orders         CBC         hCG, quantitative, pregnancy         Pregnancy, urine POC    Meds ordered this encounter  Medications   HYDROmorphone (DILAUDID) injection 1 mg   promethazine (PHENERGAN) injection 12.5 mg   oxycodone (OXY-IR) 5 MG capsule    Sig: Take 1 capsule (5 mg total) by mouth every 6 (six) hours as needed for up to 5 days (breakthrough pain).  Dispense:  20 capsule    Refill:  0     Order Specific Question:   Supervising Provider    Answer:   Lynnda Shields A [[id number]]   promethazine (PHENERGAN) 25 MG tablet    Sig: Take 1 tablet (25 mg total) by mouth every 6 (six) hours as needed for nausea or vomiting.    Dispense:  20 tablet    Refill:  0    Order Specific Question:   Supervising Provider    Answer:   Lynnda Shields A [[id number]]   Imaging Orders         US OB LESS THAN 14 WEEKS WITH OB TRANSVAGINAL      MDM +UPT UA, CBC, ABO/Rh, quant hCG, and [place] today to rule out ectopic pregnancy which can be life threatening.   Patient had vaginal swabs done recently that were negative & declines swab this evening.   RH positive  Ultrasound shows IUGS with YS in lower uterine segment. GS measuring 23.2 mm  Patient reports ultrasound with [facility] on 12/29 that showed her measuring [redacted]w[redacted]d & no cardiac activity. We don't have access to that imaging. Discussed with patient this appears to be a miscarriage in process. Will proceed with expectant management.   Consulted with Dr. Roselie Awkward regarding patient's management.  Assessment and Plan   1. Pregnancy with uncertain fetal viability, single or unspecified fetus   2. [redacted] weeks gestation of pregnancy    -Likely miscarriage in progress but don't have access to initial imaging. Will manage expectantly with close follow up.  -Reviewed bleeding precautions & reasons to return to MAU. Pelvic rest.  -Rx oxycodone & phenergan prn -Dr. Harrington Challenger notified of diagnosis - patient to call office in morning for 1 week follow up appointment   Jorje Guild, NP 10/27/22 10:25 PM

## 2022-10-27 NOTE — MAU Provider Note (Signed)
Discharge Oxy capsules not available at pharmacy This was discontinued New Rx for Oxy IR 5mg  sent

## 2022-10-27 NOTE — MAU Note (Signed)
Sonya Alexander is a 27 y.o. here in MAU reporting: started spotting on dec 19 when she found out she was pregnant. Went to a mobile clinic a couple weeks ago for an u/s but there was no heartbeat. Bleeding got a little bit heavier after the u/s. Today started cramping and then when she was leaving work she felt increased bleeding. States it was soaking through her pants.   LMP: 08/08/22 approximate  Onset of complaint: ongoing  Pain score: 5/10 and sometimes it rises to a 10/10  Vitals:   10/27/22 1907  BP: (!) 119/96  Pulse: 86  Resp: 16  Temp: 98.4 F (36.9 C)  SpO2: 99%     FHT:NA  Lab orders placed from triage: upt

## 2023-08-17 ENCOUNTER — Other Ambulatory Visit: Payer: Self-pay

## 2023-08-17 ENCOUNTER — Encounter (HOSPITAL_BASED_OUTPATIENT_CLINIC_OR_DEPARTMENT_OTHER): Payer: Self-pay

## 2023-08-17 ENCOUNTER — Emergency Department (HOSPITAL_BASED_OUTPATIENT_CLINIC_OR_DEPARTMENT_OTHER): Payer: BC Managed Care – PPO

## 2023-08-17 ENCOUNTER — Emergency Department (HOSPITAL_BASED_OUTPATIENT_CLINIC_OR_DEPARTMENT_OTHER): Payer: BC Managed Care – PPO | Admitting: Radiology

## 2023-08-17 ENCOUNTER — Emergency Department (HOSPITAL_BASED_OUTPATIENT_CLINIC_OR_DEPARTMENT_OTHER)
Admission: EM | Admit: 2023-08-17 | Discharge: 2023-08-17 | Disposition: A | Discharge status: Home / Self Care | Payer: BC Managed Care – PPO | Attending: Emergency Medicine | Admitting: Emergency Medicine

## 2023-08-17 DIAGNOSIS — M545 Low back pain, unspecified: Secondary | ICD-10-CM | POA: Insufficient documentation

## 2023-08-17 DIAGNOSIS — S134XXA Sprain of ligaments of cervical spine, initial encounter: Secondary | ICD-10-CM | POA: Insufficient documentation

## 2023-08-17 DIAGNOSIS — M542 Cervicalgia: Secondary | ICD-10-CM | POA: Diagnosis present

## 2023-08-17 DIAGNOSIS — M546 Pain in thoracic spine: Secondary | ICD-10-CM | POA: Diagnosis not present

## 2023-08-17 DIAGNOSIS — Y92410 Unspecified street and highway as the place of occurrence of the external cause: Secondary | ICD-10-CM | POA: Diagnosis not present

## 2023-08-17 LAB — PREGNANCY, URINE: Preg Test, Ur: NEGATIVE

## 2023-08-17 MED ORDER — CYCLOBENZAPRINE HCL 10 MG PO TABS: 10.0000 mg | ORAL_TABLET | Freq: Two times a day (BID) | ORAL | 0 refills | Status: DC | PRN

## 2023-08-17 MED ORDER — ACETAMINOPHEN 500 MG PO TABS
1000.0000 mg | ORAL_TABLET | Freq: Once | ORAL | Status: AC
  Administered 2023-08-17: 1000 mg via ORAL
  Filled 2023-08-17: qty 2

## 2023-08-17 MED ORDER — IBUPROFEN 400 MG PO TABS
600.0000 mg | ORAL_TABLET | Freq: Once | ORAL | Status: AC
  Administered 2023-08-17: 600 mg via ORAL
  Filled 2023-08-17: qty 1
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
## 2024-03-26 ENCOUNTER — Emergency Department (HOSPITAL_COMMUNITY)

## 2024-03-26 ENCOUNTER — Emergency Department (HOSPITAL_COMMUNITY)
Admission: EM | Admit: 2024-03-26 | Discharge: 2024-03-26 | Disposition: A | Discharge status: Home / Self Care | Attending: Emergency Medicine | Admitting: Emergency Medicine

## 2024-03-26 DIAGNOSIS — Z79899 Other long term (current) drug therapy: Secondary | ICD-10-CM | POA: Insufficient documentation

## 2024-03-26 DIAGNOSIS — S80212A Abrasion, left knee, initial encounter: Secondary | ICD-10-CM | POA: Diagnosis not present

## 2024-03-26 DIAGNOSIS — E876 Hypokalemia: Secondary | ICD-10-CM | POA: Diagnosis not present

## 2024-03-26 DIAGNOSIS — S2020XA Contusion of thorax, unspecified, initial encounter: Secondary | ICD-10-CM | POA: Insufficient documentation

## 2024-03-26 DIAGNOSIS — M25571 Pain in right ankle and joints of right foot: Secondary | ICD-10-CM | POA: Insufficient documentation

## 2024-03-26 DIAGNOSIS — S8011XA Contusion of right lower leg, initial encounter: Secondary | ICD-10-CM | POA: Insufficient documentation

## 2024-03-26 DIAGNOSIS — M25531 Pain in right wrist: Secondary | ICD-10-CM | POA: Diagnosis not present

## 2024-03-26 DIAGNOSIS — S8012XA Contusion of left lower leg, initial encounter: Secondary | ICD-10-CM | POA: Insufficient documentation

## 2024-03-26 DIAGNOSIS — S301XXA Contusion of abdominal wall, initial encounter: Secondary | ICD-10-CM | POA: Diagnosis not present

## 2024-03-26 DIAGNOSIS — M25572 Pain in left ankle and joints of left foot: Secondary | ICD-10-CM | POA: Diagnosis not present

## 2024-03-26 DIAGNOSIS — Y92410 Unspecified street and highway as the place of occurrence of the external cause: Secondary | ICD-10-CM | POA: Insufficient documentation

## 2024-03-26 DIAGNOSIS — M25532 Pain in left wrist: Secondary | ICD-10-CM | POA: Insufficient documentation

## 2024-03-26 DIAGNOSIS — S299XXA Unspecified injury of thorax, initial encounter: Secondary | ICD-10-CM | POA: Diagnosis present

## 2024-03-26 DIAGNOSIS — R930 Abnormal findings on diagnostic imaging of skull and head, not elsewhere classified: Secondary | ICD-10-CM | POA: Insufficient documentation

## 2024-03-26 DIAGNOSIS — J45909 Unspecified asthma, uncomplicated: Secondary | ICD-10-CM | POA: Diagnosis not present

## 2024-03-26 DIAGNOSIS — S8010XA Contusion of unspecified lower leg, initial encounter: Secondary | ICD-10-CM

## 2024-03-26 LAB — CBC
HCT: 39.6 % (ref 36.0–46.0)
Hemoglobin: 13.1 g/dL (ref 12.0–15.0)
MCH: 32.8 pg (ref 26.0–34.0)
MCHC: 33.1 g/dL (ref 30.0–36.0)
MCV: 99.2 fL (ref 80.0–100.0)
Platelets: 306 10*3/uL (ref 150–400)
RBC: 3.99 MIL/uL (ref 3.87–5.11)
RDW: 13.1 % (ref 11.5–15.5)
WBC: 7 10*3/uL (ref 4.0–10.5)
nRBC: 0 % (ref 0.0–0.2)

## 2024-03-26 LAB — COMPREHENSIVE METABOLIC PANEL WITH GFR
ALT: 18 U/L (ref 0–44)
AST: 20 U/L (ref 15–41)
Albumin: 3.9 g/dL (ref 3.5–5.0)
Alkaline Phosphatase: 63 U/L (ref 38–126)
Anion gap: 8 (ref 5–15)
BUN: <5 mg/dL — ABNORMAL LOW (ref 6–20)
CO2: 23 mmol/L (ref 22–32)
Calcium: 9.1 mg/dL (ref 8.9–10.3)
Chloride: 108 mmol/L (ref 98–111)
Creatinine, Ser: 0.66 mg/dL (ref 0.44–1.00)
GFR, Estimated: >60 mL/min (ref >60)
Glucose, Bld: 102 mg/dL — ABNORMAL HIGH (ref 70–99)
Potassium: 3.3 mmol/L — ABNORMAL LOW (ref 3.5–5.1)
Sodium: 139 mmol/L (ref 135–145)
Total Bilirubin: 0.3 mg/dL (ref 0.0–1.2)
Total Protein: 6.5 g/dL (ref 6.5–8.1)

## 2024-03-26 LAB — I-STAT CHEM 8, ED
BUN: <3 mg/dL — ABNORMAL LOW (ref 6–20)
Calcium, Ion: 1.12 mmol/L — ABNORMAL LOW (ref 1.15–1.40)
Chloride: 105 mmol/L (ref 98–111)
Creatinine, Ser: 0.9 mg/dL (ref 0.44–1.00)
Glucose, Bld: 102 mg/dL — ABNORMAL HIGH (ref 70–99)
HCT: 40 % (ref 36.0–46.0)
Hemoglobin: 13.6 g/dL (ref 12.0–15.0)
Potassium: 3.3 mmol/L — ABNORMAL LOW (ref 3.5–5.1)
Sodium: 142 mmol/L (ref 135–145)
TCO2: 21 mmol/L — ABNORMAL LOW (ref 22–32)

## 2024-03-26 LAB — URINALYSIS, ROUTINE W REFLEX MICROSCOPIC
Bilirubin Urine: NEGATIVE
Glucose, UA: NEGATIVE mg/dL
Hgb urine dipstick: NEGATIVE
Ketones, ur: NEGATIVE mg/dL
Leukocytes,Ua: NEGATIVE
Nitrite: NEGATIVE
Protein, ur: NEGATIVE mg/dL
Specific Gravity, Urine: 1.001 — ABNORMAL LOW (ref 1.005–1.030)
pH: 6 (ref 5.0–8.0)

## 2024-03-26 LAB — PROTIME-INR
INR: 1 (ref 0.8–1.2)
Prothrombin Time: 13.1 s (ref 11.4–15.2)

## 2024-03-26 LAB — SAMPLE TO BLOOD BANK

## 2024-03-26 LAB — PREGNANCY, URINE: Preg Test, Ur: NEGATIVE

## 2024-03-26 LAB — ETHANOL: Alcohol, Ethyl (B): 201 mg/dL — ABNORMAL HIGH (ref <15)

## 2024-03-26 LAB — I-STAT CG4 LACTIC ACID, ED: Lactic Acid, Venous: 2.4 mmol/L (ref 0.5–1.9)

## 2024-03-26 MED ORDER — MORPHINE SULFATE (PF) 4 MG/ML IV SOLN
4.0000 mg | Freq: Once | INTRAVENOUS | Status: AC
  Administered 2024-03-26: 4 mg via INTRAVENOUS
  Filled 2024-03-26: qty 1

## 2024-03-26 MED ORDER — CYCLOBENZAPRINE HCL 10 MG PO TABS: 10.0000 mg | ORAL_TABLET | Freq: Two times a day (BID) | ORAL | 0 refills | Status: AC | PRN

## 2024-03-26 MED ORDER — ONDANSETRON HCL 4 MG/2ML IJ SOLN
4.0000 mg | Freq: Once | INTRAMUSCULAR | Status: AC
  Administered 2024-03-26: 4 mg via INTRAVENOUS
  Filled 2024-03-26: qty 2

## 2024-03-26 MED ORDER — IOHEXOL 350 MG/ML SOLN
75.0000 mL | Freq: Once | INTRAVENOUS | Status: AC | PRN
  Administered 2024-03-26: 75 mL via INTRAVENOUS

## 2024-03-26 MED ORDER — IBUPROFEN 600 MG PO TABS: 600.0000 mg | ORAL_TABLET | Freq: Four times a day (QID) | ORAL | 0 refills | Status: AC | PRN
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
## 2024-03-26 NOTE — ED Notes (Signed)
 Cat scan delayed because pt wants to talk to [facility].
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
